# Patient Record
Sex: Female | Born: 1975 | Race: White | Hispanic: No | Smoking: Never smoker
Health system: Southern US, Community
[De-identification: ages and names within clinical notes are randomized; demographics above are authoritative.]

## PROBLEM LIST (undated history)

## (undated) DIAGNOSIS — R87629 Unspecified abnormal cytological findings in specimens from vagina: Secondary | ICD-10-CM

## (undated) DIAGNOSIS — D509 Iron deficiency anemia, unspecified: Secondary | ICD-10-CM

## (undated) HISTORY — PX: OTHER SURGICAL HISTORY: SHX169

## (undated) HISTORY — DX: Unspecified abnormal cytological findings in specimens from vagina: R87.629

## (undated) HISTORY — DX: Iron deficiency anemia, unspecified: D50.9

## (undated) HISTORY — PX: LEEP: SHX91

## (undated) HISTORY — PX: APPENDECTOMY: SHX54

---

## 2015-10-25 LAB — HM PAP SMEAR: HM Pap smear: NORMAL

## 2015-10-26 LAB — HM MAMMOGRAPHY

## 2015-10-30 LAB — HM MAMMOGRAPHY

## 2016-09-30 ENCOUNTER — Ambulatory Visit: Payer: Self-pay | Admitting: Osteopathic Medicine

## 2016-10-08 ENCOUNTER — Ambulatory Visit (INDEPENDENT_AMBULATORY_CARE_PROVIDER_SITE_OTHER): Payer: BLUE CROSS/BLUE SHIELD | Admitting: Osteopathic Medicine

## 2016-10-08 ENCOUNTER — Encounter: Payer: Self-pay | Admitting: Osteopathic Medicine

## 2016-10-08 VITALS — BP 135/82 | HR 99 | Ht 64.0 in | Wt 136.0 lb

## 2016-10-08 DIAGNOSIS — G8929 Other chronic pain: Secondary | ICD-10-CM | POA: Diagnosis not present

## 2016-10-08 DIAGNOSIS — M545 Low back pain, unspecified: Secondary | ICD-10-CM

## 2016-10-08 DIAGNOSIS — Z0000 Encounter for general adult medical examination without abnormal findings: Secondary | ICD-10-CM

## 2016-10-08 NOTE — Patient Instructions (Addendum)
To get old vaccine records: call old [place] or [place] where your husband worked to obtain vaccination records.   If you haven't heard back about chiropractic referral, please call us or call your insurance company to see if this service is covered

## 2016-10-08 NOTE — Progress Notes (Signed)
HPI: Angelica Kelly is a 40 y.o. female  who presents to [place] today, 10/08/16,  for chief complaint of:  Chief Complaint  Patient presents with  . Establish Care    New patient here to establish care, preventive care reviewed as below, no major concerns at this time.   Green card, immigrated with family from [place], 54 or 11/2013. Does not have immunizations on file, she says these are probably on file with her husband's previous employer sponsored their green cards but she is not sure how to access these records.  Request referral to chiropractic for low back pain   Past medical, surgical, social and family history reviewed: History reviewed. No pertinent past medical history. History reviewed. No pertinent surgical history. Social History  Substance Use Topics  . Smoking status: Never Smoker  . Smokeless tobacco: Never Used  . Alcohol use Not on file   History reviewed. No pertinent family history.   Current medication list and allergy/intolerance information reviewed:   No current outpatient prescriptions on file.   No current facility-administered medications for this visit.    No Known Allergies    Review of Systems:  Constitutional:  No  fever, no chills, No recent illness, No unintentional weight changes. No significant fatigue.   HEENT: No  headache, no vision change, no hearing change, No sore throat, No  sinus pressure  Cardiac: No  chest pain, No  pressure, No palpitations, No  Orthopnea  Respiratory:  No  shortness of breath. No  Cough  Gastrointestinal: No  abdominal pain, No  nausea, No  vomiting,  No  blood in stool, No  diarrhea, No  constipation   Musculoskeletal: No new myalgia/arthralgia  Genitourinary: No  incontinence, No  abnormal genital bleeding, No abnormal genital discharge  Skin: No  Rash, No other wounds/concerning lesions  Hem/Onc: No  easy bruising/bleeding, No  abnormal lymph node  Endocrine:  No cold intolerance,  No heat intolerance. No polyuria/polydipsia/polyphagia   Neurologic: No  weakness, No  dizziness, No  slurred speech/focal weakness/facial droop  Psychiatric: No  concerns with depression, No  concerns with anxiety, No sleep problems, No mood problems  Exam:  BP 135/82   Pulse 99   Ht 5\' 4"  (1.626 m)   Wt 136 lb (61.7 kg)   BMI 23.34 kg/m   Constitutional: VS see above. General Appearance: alert, well-developed, well-nourished, NAD  Eyes: Normal lids and conjunctive, non-icteric sclera  Ears, Nose, Mouth, Throat: MMM, Normal external inspection ears/nares/mouth/lips/gums. TM normal bilaterally. Pharynx/tonsils no erythema, no exudate. Nasal mucosa normal.   Neck: No masses, trachea midline. No thyroid enlargement. No tenderness/mass appreciated. No lymphadenopathy  Respiratory: Normal respiratory effort. no wheeze, no rhonchi, no rales  Cardiovascular: S1/S2 normal, no murmur, no rub/gallop auscultated. RRR. No lower extremity edema.  Gastrointestinal: Nontender, no masses. No hepatomegaly, no splenomegaly. No hernia appreciated. Bowel sounds normal. Rectal exam deferred.   Musculoskeletal: Gait normal. No clubbing/cyanosis of digits.   Neurological: Normal balance/coordination. No tremor.    Skin: warm, dry, intact. No rash/ulcer.   Psychiatric: Normal judgment/insight. Normal mood and affect. Oriented x3.      ASSESSMENT/PLAN:   Annual physical exam - Plan: CBC with Differential/Platelet, COMPLETE METABOLIC PANEL WITH GFR, Lipid panel, TSH, Ambulatory referral to Obstetrics / Gynecology  Chronic low back pain without sciatica, unspecified back pain laterality - Plan: Ambulatory referral to [place]  DECREASE CV RISK  Tobacco - noNever   Alcohol - social drinker  Depression - PQH2 Negative  Domestic violence concerns - no  HTN  SCREENING - SEE VITALS  Vaccination status - SEE BELOW  SEXUAL HEALTH  Sexually active in the past year - Yes with female.  STI testing today? - no  Concerns about libido or pain with sex? - no  Plans for pregnancy? - GYN previously had her on OCP but last year stopped taking them, using condoms  INFECTIOUS DISEASE SCREENING  HIV - all 15-65 - does not need  GC/CT - does not need  HepC - DOB [birth year] - does not need  TB - does not need  DISEASE SCREENING  Lipid - needs  DM2 - needs  Osteoporosis - does not need  CANCER SCREENING  Cervical - does not need - letter dated 10/25/2015 reported negative PAP and HOV co-test [facility], [place] [phone])  Breast - does not need - letter from [facility] center in [place], [place], [phone] normal mammogram 10/26/15   Lung - does not need  Colon - does not need  ADULT VACCINATION  Influenza - was offered and declined by the patient  Td - already has  HPV - was not indicated  Zoster - was not indicated  PCV13 - was not indicated  PPSV23 - was not indicated  OTHER  Fall - exercise and Vit D age 15+ - does not need  Consider ASA - age 54-59 - does not need    Patient Instructions  To get old vaccine records: call old [place] or [place] Dept of the company where your husband worked to obtain vaccination records.   If you haven't heard back about chiropractic referral, please call us or call your insurance company to see if this service is covered    Visit summary with medication list and pertinent instructions was printed for patient to review. All questions at time of visit were answered - patient instructed to contact office with any additional concerns. ER/RTC precautions were reviewed with the patient. Follow-up plan: Return in about 1 year (around 10/08/2017) for [place] .

## 2016-10-10 ENCOUNTER — Encounter: Payer: Self-pay | Admitting: Osteopathic Medicine

## 2016-10-22 ENCOUNTER — Encounter: Payer: Self-pay | Admitting: Obstetrics & Gynecology

## 2016-10-22 ENCOUNTER — Ambulatory Visit (INDEPENDENT_AMBULATORY_CARE_PROVIDER_SITE_OTHER): Payer: BLUE CROSS/BLUE SHIELD | Admitting: Obstetrics & Gynecology

## 2016-10-22 VITALS — BP 134/94 | HR 104 | Ht 64.0 in | Wt 137.0 lb

## 2016-10-22 DIAGNOSIS — Z124 Encounter for screening for malignant neoplasm of cervix: Secondary | ICD-10-CM

## 2016-10-22 DIAGNOSIS — Z01419 Encounter for gynecological examination (general) (routine) without abnormal findings: Secondary | ICD-10-CM

## 2016-10-22 DIAGNOSIS — Z1151 Encounter for screening for human papillomavirus (HPV): Secondary | ICD-10-CM | POA: Diagnosis not present

## 2016-10-22 NOTE — Progress Notes (Signed)
Subjective:    Angelica Kelly is a 40 y.o. MW P1 (12 daughter) female who presents for an annual exam. The patient has no complaints today. The patient is sexually active. GYN screening history: last pap: was normal. The patient wears seatbelts: yes. The patient participates in regular exercise: yes. Has the patient ever been transfused or tattooed?: no. The patient reports that there is not domestic violence in her life.   Menstrual History: OB History    Gravida Para Term Preterm AB Living   1 1 1     1    SAB TAB Ectopic Multiple Live Births                  Menarche age: 22 Patient's last menstrual period was 10/04/2016.    The following portions of the patient's history were reviewed and updated as appropriate: allergies, current medications, past family history, past medical history, past social history, past surgical history and problem list.  Review of Systems Pertinent items are noted in HPI.   Married for 13 years Using condoms  Declines flu vaccine Mammogram due FH- No breast/gyn/colon cancer FP has scheduled her for fasting labs Periods monthly   Objective:    BP (!) 134/94   Pulse (!) 104   Ht 5\' 4"  (1.626 m)   Wt 137 lb (62.1 kg)   LMP 10/04/2016   BMI 23.52 kg/m   General Appearance:    Alert, cooperative, no distress, appears stated age  Head:    Normocephalic, without obvious abnormality, atraumatic  Eyes:    PERRL, conjunctiva/corneas clear, EOM's intact, fundi    benign, both eyes  Ears:    Normal TM's and external ear canals, both ears  Nose:   Nares normal, septum midline, mucosa normal, no drainage    or sinus tenderness  Throat:   Lips, mucosa, and tongue normal; teeth and gums normal  Neck:   Supple, symmetrical, trachea midline, no adenopathy;    thyroid:  no enlargement/tenderness/nodules; no carotid   bruit or JVD  Back:     Symmetric, no curvature, ROM normal, no CVA tenderness  Lungs:     Clear to auscultation bilaterally, respirations  unlabored  Chest Wall:    No tenderness or deformity   Heart:    Regular rate and rhythm, S1 and S2 normal, no murmur, rub   or gallop  Breast Exam:    No tenderness, masses, or nipple abnormality  Abdomen:     Soft, non-tender, bowel sounds active all four quadrants,    no masses, no organomegaly  Genitalia:    Normal female without lesion, discharge or tenderness, NSSA, NT, mobile, normal adnexal masses     Extremities:   Extremities normal, atraumatic, no cyanosis or edema  Pulses:   2+ and symmetric all extremities  Skin:   Skin color, texture, turgor normal, no rashes or lesions  Lymph nodes:   Cervical, supraclavicular, and axillary nodes normal  Neurologic:   CNII-XII intact, normal strength, sensation and reflexes    throughout   .    Assessment:    Healthy female exam.    Plan:     Breast self exam technique reviewed and patient encouraged to perform self-exam monthly. Thin prep Pap smear. with cotesting Mammogram

## 2016-10-24 LAB — CYTOLOGY - PAP
DIAGNOSIS: NEGATIVE
HPV: NOT DETECTED

## 2016-11-06 ENCOUNTER — Encounter: Payer: Self-pay | Admitting: Osteopathic Medicine

## 2016-11-06 ENCOUNTER — Ambulatory Visit (INDEPENDENT_AMBULATORY_CARE_PROVIDER_SITE_OTHER): Payer: BLUE CROSS/BLUE SHIELD | Admitting: Osteopathic Medicine

## 2016-11-06 VITALS — BP 141/81 | HR 92 | Wt 137.0 lb

## 2016-11-06 DIAGNOSIS — K219 Gastro-esophageal reflux disease without esophagitis: Secondary | ICD-10-CM | POA: Diagnosis not present

## 2016-11-06 NOTE — Progress Notes (Signed)
HPI: Angelica Kelly is a 40 y.o. female  who presents to [facility] today, 11/06/16,  for chief complaint of:  Chief Complaint  Patient presents with  . Gastroesophageal Reflux    2 days ago burning in chest and upper back after eating spicy food; occured after laying down.     . Context: Heartburn type symptoms/chest pain 2 days ago, has altered diet in the meantime and this seems to have helped. Patient likes but seafood, drink a lot of coffee and wine. Has been more sedentary since not working . Location: Chest/epigastrium . Quality: Burning type pain, stomach feels hollow . Modifying factors: Pepto-Bismol seemed to help some, bland diet has helped . Assoc signs/symptoms: Chronic thoracic back pain seems to get worse with this    Past medical, surgical, social and family history reviewed: Patient Active Problem List   Diagnosis Date Noted  . Chronic low back pain without sciatica 10/08/2016  . Annual physical exam 10/08/2016   Past Surgical History:  Procedure Laterality Date  . APPENDECTOMY    . cervical cryo    . LEEP     Social History  Substance Use Topics  . Smoking status: Never Smoker  . Smokeless tobacco: Never Used  . Alcohol use Yes     Comment: occ   No family history on file.   Current medication list and allergy/intolerance information reviewed:   No current outpatient prescriptions on file prior to visit.   No current facility-administered medications on file prior to visit.    No Known Allergies    Review of Systems:  Constitutional: No recent illness  HEENT: No  headache, no vision change  Cardiac: No  chest pain, No  pressure, No palpitations  Respiratory:  No  shortness of breath. No  Cough  Gastrointestinal: No  abdominal pain, no change on bowel habits  Musculoskeletal: No new myalgia/arthralgia  Skin: No  Rash  Hem/Onc: No  easy bruising/bleeding, No  abnormal lumps/bumps  Neurologic: No   weakness, No  Dizziness  Psychiatric: No  concerns with depression, No  concerns with anxiety  Exam:  BP (!) 141/81   Pulse 92   Wt 137 lb (62.1 kg)   LMP 10/04/2016   BMI 23.52 kg/m   Constitutional: VS see above. General Appearance: alert, well-developed, well-nourished, NAD  Eyes: Normal lids and conjunctive, non-icteric sclera  Ears, Nose, Mouth, Throat: MMM, Normal external inspection ears/nares/mouth/lips/gums.  Neck: No masses, trachea midline.   Respiratory: Normal respiratory effort. no wheeze, no rhonchi, no rales  Cardiovascular: S1/S2 normal, no murmur, no rub/gallop auscultated. RRR.   Musculoskeletal: Gait normal. Symmetric and independent movement of all extremities. Mild paraspinal tenderness mid thoracic, patient states chronic, normal thoracic range of motion,  GI: No hepatosplenomegaly, no guarding or rebound, bowel sounds normal 4  Neurological: Normal balance/coordination. No tremor.  Skin: warm, dry, intact.   Psychiatric: Normal judgment/insight. Normal mood and affect. Oriented x3.     ASSESSMENT/PLAN: OTC medications advised, list out modifications have already resulted in improvement in symptoms. Additional information printed regarding further lifestyle changes, foods to avoid, ER/RTC precautions  Gastroesophageal reflux disease, esophagitis presence not specified    Patient Instructions  OTC treatments for Acid Reflux: Pepto-Bismol Tums tablets  Long-Term treatments with a prescription are an option if this is happening a lot of if not getting better: Omeprazole (Prilosec) or Ranitidine (Zantac)     Visit summary with medication list and pertinent instructions was printed for patient to  review. All questions at time of visit were answered - patient instructed to contact office with any additional concerns. ER/RTC precautions were reviewed with the patient. Follow-up plan: No Follow-up on file.

## 2016-11-06 NOTE — Patient Instructions (Signed)
OTC treatments for Acid Reflux: Pepto-Bismol Tums tablets  Long-Term treatments with a prescription are an option if this is happening a lot of if not getting better: Omeprazole (Prilosec) or Ranitidine (Zantac)    Food Choices for Gastroesophageal Reflux Disease, Adult When you have gastroesophageal reflux disease (GERD), the foods you eat and your eating habits are very important. Choosing the right foods can help ease the discomfort of GERD. What general guidelines do I need to follow?  Choose fruits, vegetables, whole grains, low-fat dairy products, and low-fat meat, fish, and poultry.  Limit fats such as oils, salad dressings, butter, nuts, and avocado.  Keep a food diary to identify foods that cause symptoms.  Avoid foods that cause reflux. These may be different for different people.  Eat frequent small meals instead of three large meals each day.  Eat your meals slowly, in a relaxed setting.  Limit fried foods.  Cook foods using methods other than frying.  Avoid drinking alcohol.  Avoid drinking large amounts of liquids with your meals.  Avoid bending over or lying down until 2-3 hours after eating. What foods are not recommended? The following are some foods and drinks that may worsen your symptoms: Vegetables  Tomatoes. Tomato juice. Tomato and spaghetti sauce. Chili peppers. Onion and garlic. Horseradish. Fruits  Oranges, grapefruit, and lemon (fruit and juice). Meats  High-fat meats, fish, and poultry. This includes hot dogs, ribs, ham, sausage, salami, and bacon. Dairy  Whole milk and chocolate milk. Sour cream. Cream. Butter. Ice cream. Cream cheese. Beverages  Coffee and tea, with or without caffeine. Carbonated beverages or energy drinks. Condiments  Hot sauce. Barbecue sauce. Sweets/Desserts  Chocolate and cocoa. Donuts. Peppermint and spearmint. Fats and Oils  High-fat foods, including [place] fries and potato chips. Other  Vinegar. Strong spices,  such as black pepper, white pepper, red pepper, cayenne, curry powder, cloves, ginger, and chili powder. The items listed above may not be a complete list of foods and beverages to avoid. Contact your dietitian for more information.  This information is not intended to replace advice given to you by your health care provider. Make sure you discuss any questions you have with your health care provider. Document Released: 12/02/2005 Document Revised: 05/09/2016 Document Reviewed: 10/06/2013 Elsevier Interactive Patient Education  2017 Elsevier Inc.   Heartburn Heartburn is a type of pain or discomfort that can happen in the throat or chest. It is often described as a burning pain. It may also cause a bad taste in the mouth. Heartburn may feel worse when you lie down or bend over, and it is often worse at night. Heartburn may be caused by stomach contents that move back up into the esophagus (reflux). Follow these instructions at home: Take these actions to decrease your discomfort and to help avoid complications. Diet  Follow a diet as recommended by your health care provider. This may involve avoiding foods and drinks such as:  Coffee and tea (with or without caffeine).  Drinks that contain alcohol.  Energy drinks and sports drinks.  Carbonated drinks or sodas.  Chocolate and cocoa.  Peppermint and mint flavorings.  Garlic and onions.  Horseradish.  Spicy and acidic foods, including peppers, chili powder, curry powder, vinegar, hot sauces, and barbecue sauce.  Citrus fruit juices and citrus fruits, such as oranges, lemons, and limes.  Tomato-based foods, such as red sauce, chili, salsa, and pizza with red sauce.  Fried and fatty foods, such as donuts, french fries, potato chips, and high-fat  dressings.  High-fat meats, such as hot dogs and fatty cuts of red and white meats, such as rib eye steak, sausage, ham, and bacon.  High-fat dairy items, such as whole milk, butter, and  cream cheese.  Eat small, frequent meals instead of large meals.  Avoid drinking large amounts of liquid with your meals.  Avoid eating meals during the 2-3 hours before bedtime.  Avoid lying down right after you eat.  Do not exercise right after you eat. General instructions  Pay attention to any changes in your symptoms.  Take over-the-counter and prescription medicines only as told by your health care provider. Do not take aspirin, ibuprofen, or other NSAIDs unless your health care provider told you to do so.  Do not use any tobacco products, including cigarettes, chewing tobacco, and e-cigarettes. If you need help quitting, ask your health care provider.  Wear loose-fitting clothing. Do not wear anything tight around your waist that causes pressure on your abdomen.  Raise (elevate) the head of your bed about 6 inches (15 cm).  Try to reduce your stress, such as with yoga or meditation. If you need help reducing stress, ask your health care provider.  If you are overweight, reduce your weight to an amount that is healthy for you. Ask your health care provider for guidance about a safe weight loss goal.  Keep all follow-up visits as told by your health care provider. This is important. Contact a health care provider if:  You have new symptoms.  You have unexplained weight loss.  You have difficulty swallowing, or it hurts to swallow.  You have wheezing or a persistent cough.  Your symptoms do not improve with treatment.  You have frequent heartburn for more than two weeks. Get help right away if:  You have pain in your arms, neck, jaw, teeth, or back.  You feel sweaty, dizzy, or light-headed.  You have chest pain or shortness of breath.  You vomit and your vomit looks like blood or coffee grounds.  Your stool is bloody or black. This information is not intended to replace advice given to you by your health care provider. Make sure you discuss any questions you  have with your health care provider. Document Released: 04/20/2009 Document Revised: 05/09/2016 Document Reviewed: 03/29/2015 Elsevier Interactive Patient Education  2017 [employer].

## 2016-11-19 ENCOUNTER — Ambulatory Visit (INDEPENDENT_AMBULATORY_CARE_PROVIDER_SITE_OTHER): Payer: BLUE CROSS/BLUE SHIELD

## 2016-11-19 DIAGNOSIS — Z1239 Encounter for other screening for malignant neoplasm of breast: Secondary | ICD-10-CM

## 2016-11-19 DIAGNOSIS — Z1231 Encounter for screening mammogram for malignant neoplasm of breast: Secondary | ICD-10-CM | POA: Diagnosis not present

## 2016-11-19 DIAGNOSIS — Z01419 Encounter for gynecological examination (general) (routine) without abnormal findings: Secondary | ICD-10-CM

## 2017-01-30 ENCOUNTER — Telehealth: Payer: Self-pay | Admitting: Osteopathic Medicine

## 2017-01-30 NOTE — Telephone Encounter (Signed)
Pt declined flu shot °

## 2017-02-19 ENCOUNTER — Ambulatory Visit (INDEPENDENT_AMBULATORY_CARE_PROVIDER_SITE_OTHER): Payer: BLUE CROSS/BLUE SHIELD | Admitting: Osteopathic Medicine

## 2017-02-19 ENCOUNTER — Encounter: Payer: Self-pay | Admitting: Osteopathic Medicine

## 2017-02-19 VITALS — BP 122/79 | HR 89 | Ht 64.0 in | Wt 133.0 lb

## 2017-02-19 DIAGNOSIS — R0789 Other chest pain: Secondary | ICD-10-CM | POA: Diagnosis not present

## 2017-02-19 DIAGNOSIS — G8929 Other chronic pain: Secondary | ICD-10-CM | POA: Diagnosis not present

## 2017-02-19 DIAGNOSIS — R1013 Epigastric pain: Secondary | ICD-10-CM | POA: Diagnosis not present

## 2017-02-19 DIAGNOSIS — M546 Pain in thoracic spine: Secondary | ICD-10-CM

## 2017-02-19 LAB — COMPLETE METABOLIC PANEL WITH GFR
ALBUMIN: 4.5 g/dL (ref 3.6–5.1)
ALK PHOS: 59 U/L (ref 33–115)
ALT: 8 U/L (ref 6–29)
AST: 15 U/L (ref 10–30)
BUN: 7 mg/dL (ref 7–25)
CALCIUM: 9.3 mg/dL (ref 8.6–10.2)
CHLORIDE: 104 mmol/L (ref 98–110)
CO2: 27 mmol/L (ref 20–31)
Creat: 0.67 mg/dL (ref 0.50–1.10)
GFR, Est African American: >89 mL/min (ref >=60)
Glucose, Bld: 91 mg/dL (ref 65–99)
POTASSIUM: 3.7 mmol/L (ref 3.5–5.3)
Sodium: 139 mmol/L (ref 135–146)
Total Bilirubin: 1.3 mg/dL — ABNORMAL HIGH (ref 0.2–1.2)
Total Protein: 7.9 g/dL (ref 6.1–8.1)

## 2017-02-19 LAB — LIPID PANEL
CHOL/HDL RATIO: 3 ratio (ref <5.0)
CHOLESTEROL: 200 mg/dL — AB (ref <200)
HDL: 67 mg/dL (ref >50)
LDL Cholesterol: 117 mg/dL — ABNORMAL HIGH (ref <100)
TRIGLYCERIDES: 81 mg/dL (ref <150)
VLDL: 16 mg/dL (ref <30)

## 2017-02-19 LAB — CBC WITH DIFFERENTIAL/PLATELET
BASOS ABS: 93 {cells}/uL (ref 0–200)
Basophils Relative: 3 %
EOS PCT: 2 %
Eosinophils Absolute: 62 cells/uL (ref 15–500)
HCT: 35.2 % (ref 35.0–45.0)
Hemoglobin: 11.4 g/dL — ABNORMAL LOW (ref 11.7–15.5)
LYMPHS PCT: 31 %
Lymphs Abs: 961 cells/uL (ref 850–3900)
MCH: 24.9 pg — AB (ref 27.0–33.0)
MCHC: 32.4 g/dL (ref 32.0–36.0)
MCV: 77 fL — ABNORMAL LOW (ref 80.0–100.0)
MPV: 8.8 fL (ref 7.5–12.5)
Monocytes Absolute: 496 cells/uL (ref 200–950)
Monocytes Relative: 16 %
NEUTROS PCT: 48 %
Neutro Abs: 1488 cells/uL — ABNORMAL LOW (ref 1500–7800)
Platelets: 325 10*3/uL (ref 140–400)
RBC: 4.57 MIL/uL (ref 3.80–5.10)
RDW: 16 % — AB (ref 11.0–15.0)
WBC: 3.1 10*3/uL — AB (ref 3.8–10.8)

## 2017-02-19 LAB — TSH: TSH: 2.77 mIU/L

## 2017-02-19 NOTE — Patient Instructions (Signed)
We are arranging a referral to a GI specialist who can further evaluate this abdominal discomfort. They may decide to do a scope down the throat to take a look at the stomach internally with a camera, or they may recommend other testing. If you develop serious abdominal pain along with other symptoms such as fever, nausea, throwing up, blood in the stool, or other concerning symptoms please seek medical care.

## 2017-02-19 NOTE — Progress Notes (Signed)
HPI: Angelica Kelly is a 41 y.o. female  who presents to [facility] today, 02/19/17,  for chief complaint of:  Chief Complaint  Patient presents with  . Follow-up    Acid reflux     Last seen 11/06/2016 for GERD. Seen for single episode of burning in chest and upper back after eating spicy food, worse after lying down. Described stomach as having a hollow feeling. Chronic thoracic back pain seems to get a little bit worse with this episode. Pepto-Bismol helps at that point. Patient is having some persistent feelings of something sliding in the stomach area, muscle pain in the thoracic region between the shoulder blades, bilaterally. No difficulty swallowing. No nausea/vomiting. Associated with chest discomfort and feeling like heart skips a beat sometimes but doesn't always corresponds to abdominal pain. Patient's father has a history of cancer, she is concerned that it may be something serious. Associated with fatigue, patient has not gotten lab work done.    Past medical history, surgical history, social history and family history reviewed.  Patient Active Problem List   Diagnosis Date Noted  . Chronic low back pain without sciatica 10/08/2016  . Annual physical exam 10/08/2016    Current medication list and allergy/intolerance information reviewed.   No current outpatient prescriptions on file prior to visit.   No current facility-administered medications on file prior to visit.    No Known Allergies    Review of Systems:  Constitutional: No recent illness  HEENT: No  headache, no vision change  Cardiac: +chest pain as per HPI, No  pressure, No palpitations  Respiratory:  No  shortness of breath. No  Cough  Gastrointestinal: +abdominal pain asper HPI, no change on bowel habits  Musculoskeletal: +myalgia/arthralgia in back as per HPI  Neurologic: No  weakness, No  Dizziness   Exam:  BP 122/79   Pulse 89   Ht 5\' 4"  (1.626 m)   Wt  133 lb (60.3 kg)   BMI 22.83 kg/m   Constitutional: VS see above. General Appearance: alert, well-developed, well-nourished, NAD  Eyes: Normal lids and conjunctive, non-icteric sclera  Ears, Nose, Mouth, Throat: MMM, Normal external inspection ears/nares/mouth/lips/gums.  Neck: No masses, trachea midline.   Respiratory: Normal respiratory effort. no wheeze, no rhonchi, no rales  Cardiovascular: S1/S2 normal, no murmur, no rub/gallop auscultated. RRR.   Musculoskeletal: Gait normal. Symmetric and independent movement of all extremities  Abdominal: Nontender, nondistended. Bowel sounds normal 4. Rectal exam deferred.  Neurological: Normal balance/coordination. No tremor.  Skin: warm, dry, intact.   Psychiatric: Normal judgment/insight. Anxious mood and affect. Oriented x3.    EKG interpretation: Rate: 80 Rhythm: sinus No ST/T changes concerning for acute ischemia/infarct    ASSESSMENT/PLAN:   Epigastric pain - No alarm signs for serious pathology but patient is quite concerned. GI referral, consider scope versus other workup, ? Hiatal hernia, GERD, anxiety - Plan: Ambulatory referral to Gastroenterology  Chest discomfort - No angina or claudication, sounds like PVC, EKG today NSR, no ST/T concerns   Chronic bilateral thoracic back pain - Possible viscerosomatic reflex from epigastric/stomach discomfort    Patient Instructions  We are arranging a referral to a GI specialist who can further evaluate this abdominal discomfort. They may decide to do a scope down the throat to take a look at the stomach internally with a camera, or they may recommend other testing. If you develop serious abdominal pain along with other symptoms such as fever, nausea, throwing up, blood in the  stool, or other concerning symptoms please seek medical care.     Follow-up plan: Return if symptoms worsen or fail to improve.  Visit summary with medication list and pertinent instructions was  printed for patient to review, alert [place] if any changes needed. All questions at time of visit were answered - patient instructed to contact office with any additional concerns. ER/RTC precautions were reviewed with the patient and understanding verbalized.

## 2017-02-19 NOTE — Addendum Note (Signed)
Addended by: Doree Albee on: 02/19/2017 09:52 AM   Modules accepted: Orders

## 2017-02-21 ENCOUNTER — Other Ambulatory Visit: Payer: Self-pay | Admitting: Osteopathic Medicine

## 2017-02-21 ENCOUNTER — Other Ambulatory Visit: Payer: Self-pay

## 2017-02-21 DIAGNOSIS — D508 Other iron deficiency anemias: Secondary | ICD-10-CM

## 2017-02-21 DIAGNOSIS — D619 Aplastic anemia, unspecified: Secondary | ICD-10-CM

## 2017-02-21 DIAGNOSIS — D649 Anemia, unspecified: Secondary | ICD-10-CM

## 2017-02-25 DIAGNOSIS — M9901 Segmental and somatic dysfunction of cervical region: Secondary | ICD-10-CM | POA: Diagnosis not present

## 2017-02-25 DIAGNOSIS — Q72812 Congenital shortening of left lower limb: Secondary | ICD-10-CM | POA: Diagnosis not present

## 2017-02-25 DIAGNOSIS — M5383 Other specified dorsopathies, cervicothoracic region: Secondary | ICD-10-CM | POA: Diagnosis not present

## 2017-02-25 DIAGNOSIS — M9905 Segmental and somatic dysfunction of pelvic region: Secondary | ICD-10-CM | POA: Diagnosis not present

## 2017-02-25 LAB — CBC WITH DIFFERENTIAL/PLATELET
BASOS ABS: 78 {cells}/uL (ref 0–200)
BASOS PCT: 2 %
EOS ABS: 39 {cells}/uL (ref 15–500)
Eosinophils Relative: 1 %
HEMATOCRIT: 35 % (ref 35.0–45.0)
Hemoglobin: 11.3 g/dL — ABNORMAL LOW (ref 11.7–15.5)
Lymphocytes Relative: 32 %
Lymphs Abs: 1248 cells/uL (ref 850–3900)
MCH: 25 pg — ABNORMAL LOW (ref 27.0–33.0)
MCHC: 32.3 g/dL (ref 32.0–36.0)
MCV: 77.4 fL — AB (ref 80.0–100.0)
MONO ABS: 507 {cells}/uL (ref 200–950)
MPV: 9.1 fL (ref 7.5–12.5)
Monocytes Relative: 13 %
Neutro Abs: 2028 cells/uL (ref 1500–7800)
Neutrophils Relative %: 52 %
Platelets: 300 10*3/uL (ref 140–400)
RBC: 4.52 MIL/uL (ref 3.80–5.10)
RDW: 16.3 % — AB (ref 11.0–15.0)
WBC: 3.9 10*3/uL (ref 3.8–10.8)

## 2017-02-26 ENCOUNTER — Encounter: Payer: Self-pay | Admitting: Physician Assistant

## 2017-02-26 DIAGNOSIS — D509 Iron deficiency anemia, unspecified: Secondary | ICD-10-CM | POA: Insufficient documentation

## 2017-02-26 LAB — IRON,TIBC AND FERRITIN PANEL
%SAT: 5 % — AB (ref 11–50)
Ferritin: 6 ng/mL — ABNORMAL LOW (ref 10–232)
Iron: 21 ug/dL — ABNORMAL LOW (ref 40–190)
TIBC: 382 ug/dL (ref 250–450)

## 2017-02-26 MED ORDER — FERROUS SULFATE 325 (65 FE) MG PO TBEC: 325.0000 mg | DELAYED_RELEASE_TABLET | Freq: Two times a day (BID) | ORAL | 1 refills | Status: DC

## 2017-02-27 ENCOUNTER — Ambulatory Visit (INDEPENDENT_AMBULATORY_CARE_PROVIDER_SITE_OTHER): Payer: BLUE CROSS/BLUE SHIELD | Admitting: Osteopathic Medicine

## 2017-02-27 ENCOUNTER — Encounter: Payer: Self-pay | Admitting: Osteopathic Medicine

## 2017-02-27 VITALS — BP 120/62 | HR 72 | Ht 64.0 in | Wt 135.0 lb

## 2017-02-27 DIAGNOSIS — M546 Pain in thoracic spine: Secondary | ICD-10-CM | POA: Diagnosis not present

## 2017-02-27 DIAGNOSIS — R1013 Epigastric pain: Secondary | ICD-10-CM | POA: Diagnosis not present

## 2017-02-27 DIAGNOSIS — Z7189 Other specified counseling: Secondary | ICD-10-CM | POA: Diagnosis not present

## 2017-02-27 DIAGNOSIS — G8929 Other chronic pain: Secondary | ICD-10-CM

## 2017-02-27 DIAGNOSIS — D508 Other iron deficiency anemias: Secondary | ICD-10-CM | POA: Diagnosis not present

## 2017-02-27 NOTE — Progress Notes (Signed)
HPI: Angelica Kelly is a 41 y.o. female  who presents to [facility] today, 02/27/17,  for chief complaint of:  Chief Complaint  Patient presents with  . Follow-up    LAB RESULTS    Iron deficiency anemia: New diagnosis based on lab results. I sent an iron supplement but the patient has not started this yet. Reviewed lab work in detail, see below. All questions were answered.  GI: Patient has appointment set up with GI doctor but epigastric pain seems to have gotten better. She has been seen here twice for GERD-type symptoms, possible esophageal spasm. Is reluctant to trial medications for these symptoms. Is having some associated back pain that she has some questions about whether it is related.   Past medical history, surgical history, social history and family history reviewed.  Patient Active Problem List   Diagnosis Date Noted  . Iron deficiency anemia 02/26/2017  . Epigastric pain 02/19/2017  . Chronic bilateral thoracic back pain 02/19/2017  . Chest discomfort 02/19/2017  . Chronic low back pain without sciatica 10/08/2016  . Annual physical exam 10/08/2016    Current medication list and allergy/intolerance information reviewed.   Current Outpatient Prescriptions on File Prior to Visit  Medication Sig Dispense Refill  . ferrous sulfate 325 (65 FE) MG EC tablet Take 1 tablet (325 mg total) by mouth 2 (two) times daily with a meal. (Patient not taking: Reported on 02/27/2017) 180 tablet 1   No current facility-administered medications on file prior to visit.    No Known Allergies    Review of Systems:  Cardiac: No  chest painCough  Gastrointestinal: No  abdominal pain at this time  Neurologic: No  weakness, No  Dizziness   Exam:  BP 120/62   Pulse 72   Ht 5' 4"  (1.626 m)   Wt 135 lb (61.2 kg)   BMI 23.17 kg/m   Constitutional: VS see above. General Appearance: alert, well-developed, well-nourished, NAD  Psychiatric: Normal  judgment/insight. Normal mood and affect. Oriented x3.    Recent Results (from the past 2160 hour(s))  CBC with Differential/Platelet     Status: Abnormal   Collection Time: 02/19/17  9:56 AM  Result Value Ref Range   WBC 3.1 (L) 3.8 - 10.8 K/uL   RBC 4.57 3.80 - 5.10 MIL/uL   Hemoglobin 11.4 (L) 11.7 - 15.5 g/dL   HCT 35.2 35.0 - 45.0 %   MCV 77.0 (L) 80.0 - 100.0 fL   MCH 24.9 (L) 27.0 - 33.0 pg   MCHC 32.4 32.0 - 36.0 g/dL   RDW 16.0 (H) 11.0 - 15.0 %   Platelets 325 140 - 400 K/uL   MPV 8.8 7.5 - 12.5 fL   Neutro Abs 1,488 (L) 1,500 - 7,800 cells/uL   Lymphs Abs 961 850 - 3,900 cells/uL   Monocytes Absolute 496 200 - 950 cells/uL   Eosinophils Absolute 62 15 - 500 cells/uL   Basophils Absolute 93 0 - 200 cells/uL   Neutrophils Relative % 48 %   Lymphocytes Relative 31 %   Monocytes Relative 16 %   Eosinophils Relative 2 %   Basophils Relative 3 %   Smear Review Criteria for review not met   COMPLETE METABOLIC PANEL WITH GFR     Status: Abnormal   Collection Time: 02/19/17  9:56 AM  Result Value Ref Range   Sodium 139 135 - 146 mmol/L   Potassium 3.7 3.5 - 5.3 mmol/L   Chloride 104 98 -  110 mmol/L   CO2 27 20 - 31 mmol/L   Glucose, Bld 91 65 - 99 mg/dL   BUN 7 7 - 25 mg/dL   Creat 0.67 0.50 - 1.10 mg/dL   Total Bilirubin 1.3 (H) 0.2 - 1.2 mg/dL   Alkaline Phosphatase 59 33 - 115 U/L   AST 15 10 - 30 U/L   ALT 8 6 - 29 U/L   Total Protein 7.9 6.1 - 8.1 g/dL   Albumin 4.5 3.6 - 5.1 g/dL   Calcium 9.3 8.6 - 10.2 mg/dL   GFR, Est African American >89 >=60 mL/min   GFR, Est Non African American >89 >=60 mL/min  Lipid panel     Status: Abnormal   Collection Time: 02/19/17  9:56 AM  Result Value Ref Range   Cholesterol 200 (H) <200 mg/dL   Triglycerides 81 <150 mg/dL   HDL 67 >50 mg/dL   Total CHOL/HDL Ratio 3.0 <5.0 Ratio   VLDL 16 <30 mg/dL   LDL Cholesterol 117 (H) <100 mg/dL  TSH     Status: None   Collection Time: 02/19/17  9:56 AM  Result Value Ref Range    TSH 2.77 mIU/L    Comment:   Reference Range   > or = 20 Years  0.40-4.50   Pregnancy Range First trimester  0.26-2.66 Second trimester 0.55-2.73 Third trimester  0.43-2.91     CBC with Differential/Platelet     Status: Abnormal   Collection Time: 02/25/17 11:22 AM  Result Value Ref Range   WBC 3.9 3.8 - 10.8 K/uL   RBC 4.52 3.80 - 5.10 MIL/uL   Hemoglobin 11.3 (L) 11.7 - 15.5 g/dL   HCT 35.0 35.0 - 45.0 %   MCV 77.4 (L) 80.0 - 100.0 fL   MCH 25.0 (L) 27.0 - 33.0 pg   MCHC 32.3 32.0 - 36.0 g/dL   RDW 16.3 (H) 11.0 - 15.0 %   Platelets 300 140 - 400 K/uL   MPV 9.1 7.5 - 12.5 fL   Neutro Abs 2,028 1,500 - 7,800 cells/uL   Lymphs Abs 1,248 850 - 3,900 cells/uL   Monocytes Absolute 507 200 - 950 cells/uL   Eosinophils Absolute 39 15 - 500 cells/uL   Basophils Absolute 78 0 - 200 cells/uL   Neutrophils Relative % 52 %   Lymphocytes Relative 32 %   Monocytes Relative 13 %   Eosinophils Relative 1 %   Basophils Relative 2 %   Smear Review Criteria for review not met   Iron, TIBC and Ferritin Panel     Status: Abnormal   Collection Time: 02/25/17 11:22 AM  Result Value Ref Range   Ferritin 6 (L) 10 - 232 ng/mL   Iron 21 (L) 40 - 190 ug/dL   TIBC 382 250 - 450 ug/dL   %SAT 5 (L) 11 - 50 %      ASSESSMENT/PLAN: The primary encounter diagnosis was Other iron deficiency anemia. Diagnoses of Epigastric pain, Chronic bilateral thoracic back pain, and Cardiac risk counseling were also pertinent to this visit.  Encouraged patient to keep follow-up appointment with GI We'll plan to recheck CBC and iron panel in 3 months. Discussed possible side effects of the medication and ways to mitigate these.  Orders Placed This Encounter  Procedures  . CBC  . Iron, TIBC and Ferritin Panel   orders placed today for follow-up blood work in 3 months     Follow-up plan: Return in about 3 months (around 05/30/2017) for LAB  in 3 months to recheck iron levels and anemia, otherwise for annual  physical when due.  Visit summary with medication list and pertinent instructions was printed for patient to review, alert [place] if any changes needed. All questions at time of visit were answered - patient instructed to contact office with any additional concerns. ER/RTC precautions were reviewed with the patient and understanding verbalized.   Note: Total time spent 15 minutes, greater than 50% of the visit was spent face-to-face counseling and coordinating care for the following: The primary encounter diagnosis was Other iron deficiency anemia. Diagnoses of Epigastric pain and Chronic bilateral thoracic back pain were also pertinent to this visit.Marland Kitchen

## 2017-03-07 ENCOUNTER — Encounter: Payer: Self-pay | Admitting: Physician Assistant

## 2017-03-07 ENCOUNTER — Ambulatory Visit (INDEPENDENT_AMBULATORY_CARE_PROVIDER_SITE_OTHER): Payer: BLUE CROSS/BLUE SHIELD | Admitting: Physician Assistant

## 2017-03-07 VITALS — BP 110/70 | HR 80 | Ht 64.0 in | Wt 134.6 lb

## 2017-03-07 DIAGNOSIS — R1013 Epigastric pain: Secondary | ICD-10-CM

## 2017-03-07 DIAGNOSIS — M5383 Other specified dorsopathies, cervicothoracic region: Secondary | ICD-10-CM | POA: Diagnosis not present

## 2017-03-07 DIAGNOSIS — D508 Other iron deficiency anemias: Secondary | ICD-10-CM | POA: Diagnosis not present

## 2017-03-07 DIAGNOSIS — K219 Gastro-esophageal reflux disease without esophagitis: Secondary | ICD-10-CM | POA: Diagnosis not present

## 2017-03-07 DIAGNOSIS — M9901 Segmental and somatic dysfunction of cervical region: Secondary | ICD-10-CM | POA: Diagnosis not present

## 2017-03-07 DIAGNOSIS — Q72812 Congenital shortening of left lower limb: Secondary | ICD-10-CM | POA: Diagnosis not present

## 2017-03-07 DIAGNOSIS — M9905 Segmental and somatic dysfunction of pelvic region: Secondary | ICD-10-CM | POA: Diagnosis not present

## 2017-03-07 MED ORDER — OMEPRAZOLE 20 MG PO CPDR: 20.0000 mg | DELAYED_RELEASE_CAPSULE | Freq: Every day | ORAL | 3 refills | Status: DC

## 2017-03-07 NOTE — Patient Instructions (Signed)
We have sent the following medications to your pharmacy for you to pick up at your convenience:  Omeprazole 20 mg daily 

## 2017-03-07 NOTE — Progress Notes (Signed)
Chief Complaint: GERD, epigastric abdominal pain, IDA  HPI:  Angelica Kelly is a 41 year old female with a past medical history as below, who was referred to me by Emeterio Reeve, DO for a complaint of GERD, epigastric pain and IDA.     Patient had recent CBC 02/25/17 showing a hemoglobin minimally decreased at 11.3 with an MCV low at 77.4. Iron studies show ferritin low at 6, iron low at 21 and percent saturation low at 5.   Today, the patient describes that she developed an epigastric pain a few months ago and had two "elongated" episodes of reflux. After that she changed her diet. Previously she had been eating up until midnight and drinking red wine as well as a lot of coffee. She tells me that altering her diet did help her stop her reflux but she does continue with an epigastric pain that she sometimes feels more when she bends over. Recently  the patient was feeling somewhat fatigued and tells me she was recently told that she was iron deficient. She does admit to me that she does not eat red meat at all and has a few vegetables throughout the day. She believes that most of her symptoms are related to her diet.   Patient denies fever, chills, blood in her stool, melena, weight loss, anorexia, nausea, vomiting or symptoms that awaken her at night.  Past Medical History:  Diagnosis Date  . Iron deficiency anemia   . Vaginal Pap smear, abnormal     Past Surgical History:  Procedure Laterality Date  . APPENDECTOMY    . cervical cryo    . LEEP      Current Outpatient Prescriptions  Medication Sig Dispense Refill  . docusate sodium (STOOL SOFTENER) 100 MG capsule Take 100 mg by mouth daily as needed for mild constipation.    . ferrous sulfate 325 (65 FE) MG EC tablet Take 1 tablet (325 mg total) by mouth 2 (two) times daily with a meal. 180 tablet 1  . omeprazole (PRILOSEC) 20 MG capsule Take 1 capsule (20 mg total) by mouth daily before breakfast. 30 capsule 3   No current  facility-administered medications for this visit.     Allergies as of 03/07/2017  . (No Known Allergies)    Family History  Problem Relation Age of Onset  . Colon cancer Neg Hx   . Esophageal cancer Neg Hx   . AAA (abdominal aortic aneurysm) Neg Hx     Social History   Social History  . Marital status: Married    Spouse name: N/A  . Number of children: N/A  . Years of education: N/A   Occupational History  . Not on file.   Social History Main Topics  . Smoking status: Never Smoker  . Smokeless tobacco: Never Used  . Alcohol use Yes     Comment: 2 drinks per week  . Drug use: No  . Sexual activity: Yes    Birth control/ protection: Condom   Other Topics Concern  . Not on file   Social History Narrative  . No narrative on file    Review of Systems:    Constitutional: No weight loss, fever or chills Skin: No rash  Cardiovascular: No chest pain Respiratory: No SOB  Gastrointestinal: See HPI and otherwise negative Genitourinary: No dysuria  Neurological: No headache Musculoskeletal: No new muscle or joint pain Hematologic: No bleeding Psychiatric: No history of depression or anxiety   Physical Exam:  Vital signs: BP 110/70  Pulse 80   Ht 5\' 4"  (1.626 m)   Wt 134 lb 9.6 oz (61.1 kg)   BMI 23.10 kg/m   Constitutional:   Pleasant Caucasian female appears to be in NAD, Well developed, Well nourished, alert and cooperative Head:  Normocephalic and atraumatic. Eyes:   PEERL, EOMI. No icterus. Conjunctiva pink. Ears:  Normal auditory acuity. Neck:  Supple Throat: Oral cavity and pharynx without inflammation, swelling or lesion.  Respiratory: Respirations even and unlabored. Lungs clear to auscultation bilaterally.   No wheezes, crackles, or rhonchi.  Cardiovascular: Normal S1, S2. No MRG. Regular rate and rhythm. No peripheral edema, cyanosis or pallor.  Gastrointestinal:  Soft, nondistended, nontender. No rebound or guarding. Normal bowel sounds. No  appreciable masses or hepatomegaly. Rectal:  Not performed.  Msk:  Symmetrical without gross deformities. Without edema, no deformity or joint abnormality.  Neurologic:  Alert and  oriented x4;  grossly normal neurologically.  Skin:   Dry and intact without significant lesions or rashes. Psychiatric: Demonstrates good judgement and reason without abnormal affect or behaviors.  RELEVANT LABS AND IMAGING: CBC    Component Value Date/Time   WBC 3.9 02/25/2017 1122   RBC 4.52 02/25/2017 1122   HGB 11.3 (L) 02/25/2017 1122   HCT 35.0 02/25/2017 1122   PLT 300 02/25/2017 1122   MCV 77.4 (L) 02/25/2017 1122   MCH 25.0 (L) 02/25/2017 1122   MCHC 32.3 02/25/2017 1122   RDW 16.3 (H) 02/25/2017 1122   LYMPHSABS 1,248 02/25/2017 1122   MONOABS 507 02/25/2017 1122   EOSABS 39 02/25/2017 1122   BASOSABS 78 02/25/2017 1122    CMP     Component Value Date/Time   NA 139 02/19/2017 0956   K 3.7 02/19/2017 0956   CL 104 02/19/2017 0956   CO2 27 02/19/2017 0956   GLUCOSE 91 02/19/2017 0956   BUN 7 02/19/2017 0956   CREATININE 0.67 02/19/2017 0956   CALCIUM 9.3 02/19/2017 0956   PROT 7.9 02/19/2017 0956   ALBUMIN 4.5 02/19/2017 0956   AST 15 02/19/2017 0956   ALT 8 02/19/2017 0956   ALKPHOS 59 02/19/2017 0956   BILITOT 1.3 (H) 02/19/2017 0956   GFRNONAA >89 02/19/2017 0956   GFRAA >89 02/19/2017 0956    Assessment: 1. GERD: Patient reports two "elongated episodes" of reflux with an acid material in her mouth, this has not occurred since she changed her diet and lifestyle; likely related to reflux or gastritis 2. Epigastric pain: With above, though sometimes continues with epigastric pain, worse when she bends over, consider hiatal hernia 3. IDA: Recently diagnosed with a hemoglobin minimally low at 11.3, iron is also low, patient recently started on iron twice a day, suspect that this is due to her diet after our conversation  Plan: 1. Discussed with the patient that due to the fact  that she does not eat any iron at all and her hemoglobin is only minimally decreased, do not recommend further evaluation at this time as it is likely explained by her diet. If her iron and hemoglobin do not improve on an iron supplement over the next 3 weeks, then could discuss EGD and colonoscopy further. 2. Would recommend patient start Omeprazole 20 mg daily, 30-60 minutes before breakfast. She should continue this for the next 3-4 weeks. 3. We reviewed an antireflux diet and lifestyle modifications. She was provided with a handout. 4. Patient to follow in clinic in 3-4 weeks with myself or Dr. Silverio Decamp as she is supervising physician today.  If her iron remains low or she continues with epigastric pain, recommend that she have further evaluation.  Ellouise Newer, PA-C [facility] 03/07/2017, 12:01 PM  Cc: Emeterio Reeve, DO

## 2017-03-13 DIAGNOSIS — M9905 Segmental and somatic dysfunction of pelvic region: Secondary | ICD-10-CM | POA: Diagnosis not present

## 2017-03-13 DIAGNOSIS — M5383 Other specified dorsopathies, cervicothoracic region: Secondary | ICD-10-CM | POA: Diagnosis not present

## 2017-03-13 DIAGNOSIS — Q72812 Congenital shortening of left lower limb: Secondary | ICD-10-CM | POA: Diagnosis not present

## 2017-03-13 DIAGNOSIS — M9901 Segmental and somatic dysfunction of cervical region: Secondary | ICD-10-CM | POA: Diagnosis not present

## 2017-03-13 NOTE — Progress Notes (Signed)
Reviewed and agree with documentation and assessment and plan. K. Veena Merlyn Conley , MD   

## 2017-03-28 DIAGNOSIS — Q72812 Congenital shortening of left lower limb: Secondary | ICD-10-CM | POA: Diagnosis not present

## 2017-03-28 DIAGNOSIS — M5383 Other specified dorsopathies, cervicothoracic region: Secondary | ICD-10-CM | POA: Diagnosis not present

## 2017-03-28 DIAGNOSIS — M9901 Segmental and somatic dysfunction of cervical region: Secondary | ICD-10-CM | POA: Diagnosis not present

## 2017-03-28 DIAGNOSIS — M9905 Segmental and somatic dysfunction of pelvic region: Secondary | ICD-10-CM | POA: Diagnosis not present

## 2017-04-02 ENCOUNTER — Telehealth: Payer: Self-pay

## 2017-04-02 NOTE — Telephone Encounter (Signed)
Printed release and placed in folder for records handling

## 2017-04-02 NOTE — Telephone Encounter (Signed)
Pt is requesting that her medical information be sent to her life insurance company.  The release is in the media tab for you to send it.  Please advise.

## 2017-04-07 DIAGNOSIS — M9905 Segmental and somatic dysfunction of pelvic region: Secondary | ICD-10-CM | POA: Diagnosis not present

## 2017-04-07 DIAGNOSIS — M5383 Other specified dorsopathies, cervicothoracic region: Secondary | ICD-10-CM | POA: Diagnosis not present

## 2017-04-07 DIAGNOSIS — Q72812 Congenital shortening of left lower limb: Secondary | ICD-10-CM | POA: Diagnosis not present

## 2017-04-07 DIAGNOSIS — M9901 Segmental and somatic dysfunction of cervical region: Secondary | ICD-10-CM | POA: Diagnosis not present

## 2017-04-10 ENCOUNTER — Ambulatory Visit (INDEPENDENT_AMBULATORY_CARE_PROVIDER_SITE_OTHER): Payer: BLUE CROSS/BLUE SHIELD | Admitting: Gastroenterology

## 2017-04-10 ENCOUNTER — Encounter: Payer: Self-pay | Admitting: Gastroenterology

## 2017-04-10 VITALS — BP 114/80 | HR 80 | Ht 64.57 in | Wt 136.1 lb

## 2017-04-10 DIAGNOSIS — K219 Gastro-esophageal reflux disease without esophagitis: Secondary | ICD-10-CM

## 2017-04-10 DIAGNOSIS — R1110 Vomiting, unspecified: Secondary | ICD-10-CM

## 2017-04-10 DIAGNOSIS — R1013 Epigastric pain: Secondary | ICD-10-CM | POA: Diagnosis not present

## 2017-04-10 DIAGNOSIS — R12 Heartburn: Secondary | ICD-10-CM

## 2017-04-10 DIAGNOSIS — IMO0001 Reserved for inherently not codable concepts without codable children: Secondary | ICD-10-CM

## 2017-04-10 MED ORDER — RANITIDINE HCL 150 MG PO TABS: 150.0000 mg | ORAL_TABLET | Freq: Every day | ORAL | 3 refills | Status: DC

## 2017-04-10 MED ORDER — OMEPRAZOLE 20 MG PO CPDR: 20.0000 mg | DELAYED_RELEASE_CAPSULE | Freq: Every day | ORAL | 3 refills | Status: DC

## 2017-04-10 NOTE — Progress Notes (Signed)
Angelica Kelly    [id number]    Jun 10, 1976  Primary Care Physician:Natalie Sheppard Coil, DO  Referring Physician: Emeterio Reeve, [address] [place], [place] [address]  Chief complaint:  GERD  HPI: 41 year old female here for follow-up visit, was last seen by Ellouise Newer on 03/07/2017. She continues to have intermittent heartburn, epigastric discomfort, regurgitation and acid taste associated with burning sensation in the throat if she does not take omeprazole. Symptoms were improved when she was taking omeprazole 20 mg daily with only occasional breakthrough symptoms at nighttime. She discontinued PPI about 3 days ago with rebound symptoms, she is also having intermittent trouble swallowing especially when she gets the heartburn or epigastric discomfort and feels the food takes longer to go down. She is trying to change her diet and has decreased drinking wine, avoiding coffee and chocolate. Denies any nausea, vomiting, abdominal pain, melena or bright red blood per rectum    Outpatient Encounter Prescriptions as of 04/10/2017  Medication Sig  . docusate sodium (STOOL SOFTENER) 100 MG capsule Take 100 mg by mouth as needed for mild constipation.   . ferrous sulfate 325 (65 FE) MG EC tablet Take 1 tablet (325 mg total) by mouth 2 (two) times daily with a meal.  . [DISCONTINUED] omeprazole (PRILOSEC) 20 MG capsule Take 1 capsule (20 mg total) by mouth daily before breakfast.   No facility-administered encounter medications on file as of 04/10/2017.     Allergies as of 04/10/2017  . (No Known Allergies)    Past Medical History:  Diagnosis Date  . Iron deficiency anemia   . Vaginal Pap smear, abnormal     Past Surgical History:  Procedure Laterality Date  . APPENDECTOMY    . cervical cryo    . LEEP      Family History  Problem Relation Age of Onset  . Colon cancer Neg Hx   . Esophageal cancer Neg Hx   . AAA (abdominal aortic aneurysm) Neg  Hx     Social History   Social History  . Marital status: Married    Spouse name: N/A  . Number of children: N/A  . Years of education: N/A   Occupational History  . Not on file.   Social History Main Topics  . Smoking status: Never Smoker  . Smokeless tobacco: Never Used  . Alcohol use Yes     Comment: 2 drinks per week  . Drug use: No  . Sexual activity: Yes    Birth control/ protection: Condom   Other Topics Concern  . Not on file   Social History Narrative  . No narrative on file      Review of systems: Review of Systems  Constitutional: Negative for fever and chills.  HENT: Negative.   Eyes: Negative for blurred vision.  Respiratory: Negative for cough, shortness of breath and wheezing.   Cardiovascular: Negative for chest pain and palpitations.  Gastrointestinal: as per HPI Genitourinary: Negative for dysuria, urgency, frequency and hematuria.  Musculoskeletal: Negative for myalgias, back pain and joint pain.  Skin: Negative for itching and rash.  Neurological: Negative for dizziness, tremors, focal weakness, seizures and loss of consciousness.  Endo/Heme/Allergies: Negative for seasonal allergies.  Psychiatric/Behavioral: Negative for depression, suicidal ideas and hallucinations.  All other systems reviewed and are negative.   Physical Exam: Vitals:   04/10/17 0903  BP: 114/80  Pulse: 80   Body mass index is 22.96 kg/m. Gen:  No acute distress HEENT:  EOMI, sclera anicteric Neck:     No masses; no thyromegaly Lungs:    Clear to auscultation bilaterally; normal respiratory effort CV:         Regular rate and rhythm; no murmurs Abd:      + bowel sounds; soft, non-tender; no palpable masses, no distension Ext:    No edema; adequate peripheral perfusion Skin:      Warm and dry; no rash Neuro: alert and oriented x 3 Psych: normal mood and affect  Data Reviewed:  Reviewed labs, radiology imaging, old records and pertinent past GI work  up   Assessment and Plan/Recommendations:  41 year old female here for follow-up with complaints of  intermittent heartburn, epigastric discomfort, acid taste and regurgitation suggestive of gastroesophageal reflux disease She did have improvement with omeprazole 20 mg daily. Patient discontinued as she does not want to be on medication long-term I reassured patient and advised her to restart omeprazole 20 mg daily, 30 minutes before breakfast and add Zantac 150 mg at bedtime as needed She can try to wean off PPI in next 6-8 weeks when symptoms stable and continue H2 blocker as needed If continues to have persistent symptoms we'll consider EGD to evaluate and exclude esophagitis, peptic stricture, ulcer disease or a large hiatal hernia Discussed antireflux diet and lifestyle in detail Return in 2-3 months or sooner if needed  25 minutes was spent face-to-face with the patient. Greater than 50% of the time used for counseling as well as treatment plan and follow-up of GERD. She had multiple questions which were answered to her satisfaction  K. Denzil Magnuson , MD [phone] Mon-Fri 8a-5p [phone] after 5p, weekends, holidays  CC: Emeterio Reeve, DO

## 2017-04-10 NOTE — Patient Instructions (Signed)
We will schedule a possible endoscopy if no improvements, we already have given you blank printed endoscopy instructions in case you call back to schedule  We will send omeprazole and zantac to your pharmacy   Food Choices for Gastroesophageal Reflux Disease, Adult When you have gastroesophageal reflux disease (GERD), the foods you eat and your eating habits are very important. Choosing the right foods can help ease your discomfort. What guidelines do I need to follow?  Choose fruits, vegetables, whole grains, and low-fat dairy products.  Choose low-fat meat, fish, and poultry.  Limit fats such as oils, salad dressings, butter, nuts, and avocado.  Keep a food diary. This helps you identify foods that cause symptoms.  Avoid foods that cause symptoms. These may be different for everyone.  Eat small meals often instead of 3 large meals a day.  Eat your meals slowly, in a place where you are relaxed.  Limit fried foods.  Cook foods using methods other than frying.  Avoid drinking alcohol.  Avoid drinking large amounts of liquids with your meals.  Avoid bending over or lying down until 2-3 hours after eating. What foods are not recommended? These are some foods and drinks that may make your symptoms worse: Vegetables  Tomatoes. Tomato juice. Tomato and spaghetti sauce. Chili peppers. Onion and garlic. Horseradish. Fruits  Oranges, grapefruit, and lemon (fruit and juice). Meats  High-fat meats, fish, and poultry. This includes hot dogs, ribs, ham, sausage, salami, and bacon. Dairy  Whole milk and chocolate milk. Sour cream. Cream. Butter. Ice cream. Cream cheese. Drinks  Coffee and tea. Bubbly (carbonated) drinks or energy drinks. Condiments  Hot sauce. Barbecue sauce. Sweets/Desserts  Chocolate and cocoa. Donuts. Peppermint and spearmint. Fats and Oils  High-fat foods. This includes [place] fries and potato chips. Other  Vinegar. Strong spices. This includes black pepper,  white pepper, red pepper, cayenne, curry powder, cloves, ginger, and chili powder. The items listed above may not be a complete list of foods and drinks to avoid. Contact your dietitian for more information.  This information is not intended to replace advice given to you by your health care provider. Make sure you discuss any questions you have with your health care provider. Document Released: 06/02/2012 Document Revised: 05/09/2016 Document Reviewed: 10/06/2013 Elsevier Interactive Patient Education  2017 [place].    Gastroesophageal Reflux Disease, Adult Normally, food travels down the esophagus and stays in the stomach to be digested. If a person has gastroesophageal reflux disease (GERD), food and stomach acid move back up into the esophagus. When this happens, the esophagus becomes sore and swollen (inflamed). Over time, GERD can make small holes (ulcers) in the lining of the esophagus. Follow these instructions at home: Diet   Follow a diet as told by your doctor. You may need to avoid foods and drinks such as:  Coffee and tea (with or without caffeine).  Drinks that contain alcohol.  Energy drinks and sports drinks.  Carbonated drinks or sodas.  Chocolate and cocoa.  Peppermint and mint flavorings.  Garlic and onions.  Horseradish.  Spicy and acidic foods, such as peppers, chili powder, curry powder, vinegar, hot sauces, and BBQ sauce.  Citrus fruit juices and citrus fruits, such as oranges, lemons, and limes.  Tomato-based foods, such as red sauce, chili, salsa, and pizza with red sauce.  Fried and fatty foods, such as donuts, french fries, potato chips, and high-fat dressings.  High-fat meats, such as hot dogs, rib eye steak, sausage, ham, and bacon.  High-fat dairy items, such as whole milk, butter, and cream cheese.  Eat small meals often. Avoid eating large meals.  Avoid drinking large amounts of liquid with your meals.  Avoid eating meals during the  2-3 hours before bedtime.  Avoid lying down right after you eat.  Do not exercise right after you eat. General instructions   Pay attention to any changes in your symptoms.  Take over-the-counter and prescription medicines only as told by your doctor. Do not take aspirin, ibuprofen, or other NSAIDs unless your doctor says it is okay.  Do not use any tobacco products, including cigarettes, chewing tobacco, and e-cigarettes. If you need help quitting, ask your doctor.  Wear loose clothes. Do not wear anything tight around your waist.  Raise (elevate) the head of your bed about 6 inches (15 cm).  Try to lower your stress. If you need help doing this, ask your doctor.  If you are overweight, lose an amount of weight that is healthy for you. Ask your doctor about a safe weight loss goal.  Keep all follow-up visits as told by your doctor. This is important. Contact a doctor if:  You have new symptoms.  You lose weight and you do not know why it is happening.  You have trouble swallowing, or it hurts to swallow.  You have wheezing or a cough that keeps happening.  Your symptoms do not get better with treatment.  You have a hoarse voice. Get help right away if:  You have pain in your arms, neck, jaw, teeth, or back.  You feel sweaty, dizzy, or light-headed.  You have chest pain or shortness of breath.  You throw up (vomit) and your throw up looks like blood or coffee grounds.  You pass out (faint).  Your poop (stool) is bloody or black.  You cannot swallow, drink, or eat. This information is not intended to replace advice given to you by your health care provider. Make sure you discuss any questions you have with your health care provider. Document Released: 05/20/2008 Document Revised: 05/09/2016 Document Reviewed: 03/29/2015 Elsevier Interactive Patient Education  2017 [employer].

## 2017-04-22 DIAGNOSIS — M9905 Segmental and somatic dysfunction of pelvic region: Secondary | ICD-10-CM | POA: Diagnosis not present

## 2017-04-22 DIAGNOSIS — Q72812 Congenital shortening of left lower limb: Secondary | ICD-10-CM | POA: Diagnosis not present

## 2017-04-22 DIAGNOSIS — M5383 Other specified dorsopathies, cervicothoracic region: Secondary | ICD-10-CM | POA: Diagnosis not present

## 2017-04-22 DIAGNOSIS — M9901 Segmental and somatic dysfunction of cervical region: Secondary | ICD-10-CM | POA: Diagnosis not present

## 2018-05-20 ENCOUNTER — Encounter: Payer: BLUE CROSS/BLUE SHIELD | Admitting: Osteopathic Medicine

## 2018-06-25 ENCOUNTER — Encounter: Payer: BLUE CROSS/BLUE SHIELD | Admitting: Osteopathic Medicine

## 2018-07-27 ENCOUNTER — Encounter: Payer: Self-pay | Admitting: Osteopathic Medicine

## 2018-07-27 ENCOUNTER — Ambulatory Visit (INDEPENDENT_AMBULATORY_CARE_PROVIDER_SITE_OTHER): Payer: BLUE CROSS/BLUE SHIELD | Admitting: Osteopathic Medicine

## 2018-07-27 VITALS — BP 131/88 | HR 80 | Wt 140.0 lb

## 2018-07-27 DIAGNOSIS — Z1231 Encounter for screening mammogram for malignant neoplasm of breast: Secondary | ICD-10-CM

## 2018-07-27 DIAGNOSIS — Z1239 Encounter for other screening for malignant neoplasm of breast: Secondary | ICD-10-CM

## 2018-07-27 DIAGNOSIS — Z0000 Encounter for general adult medical examination without abnormal findings: Secondary | ICD-10-CM | POA: Diagnosis not present

## 2018-07-27 DIAGNOSIS — R8290 Unspecified abnormal findings in urine: Secondary | ICD-10-CM

## 2018-07-27 DIAGNOSIS — R3121 Asymptomatic microscopic hematuria: Secondary | ICD-10-CM | POA: Diagnosis not present

## 2018-07-27 MED ORDER — FERROUS SULFATE 325 (65 FE) MG PO TBEC: 325.0000 mg | DELAYED_RELEASE_TABLET | ORAL | 1 refills | Status: DC

## 2018-07-27 NOTE — Patient Instructions (Signed)
Review preventive care:   General Preventive Care  Most recent routine screening lipids/other labs:   Tobacco: don't! Alcohol: moderation is ok for most people. Recreational/Illicit Drugs: don't!  Exercise: as tolerated to reduce risk of cardiovascular disease and diabetes  Mental health: if need for mental health care (medicines, counseling, other), or concerns about moods, please let me know!    Vaccines  Flu vaccine: recommended every fall (by Halloween!)  Shingles vaccine: Shingrix recommended after age 61  Pneumonia vaccines: Prevnar and Pneumovax recommended after age 79  Tetanus booster: Tdap recommended every 10 years  Cancer screenings   Colon cancer screening: recommended at age 25, colonoscopy sooner if risk factors   Breast cancer screening: mammogram recommended at age 72, have ordered mammogram  Cervical cancer screening: every 1 to 5 years depending on age and other risk factors. Can schedule with OBGYN Dr. Hulan Fray   Infection screenings . HIV: recommended screening at least once age 41-65, more often if risk factors  . Gonorrhea/Chlamydia: otherwise screening as needed . Hepatitis C: recommended for anyone born 25-1966  Other . Bone Density Test: recommended for women at age 19 . Advanced Directive: Living Will and/or Healthcare Power of Attorney recommended for everyone, regardless of age or health . Cholesterol: recommended screening annually . Diabetes: recommended screening annually  . Thyroid and Vitamin D: routine screening not recommended, most insurance will not cover this test

## 2018-07-27 NOTE — Progress Notes (Signed)
HPI: Angelica Kelly is a 42 y.o. female who  has a past medical history of Iron deficiency anemia and Vaginal Pap smear, abnormal.  she presents to [facility] today, 07/27/18,  for chief complaint of: Annual Physical    Patient here for annual physical / wellness exam.  See preventive care reviewed as below.  Recent labs reviewed in detail with the patient.   Additional concerns today include:  GI issues better, med list updated Reports urine testing through insurance was abnormal, was told she needed a colonoscopy?     Past medical, surgical, social and family history reviewed:  Patient Active Problem List   Diagnosis Date Noted  . Cardiac risk counseling 02/27/2017  . Iron deficiency anemia 02/26/2017  . Epigastric pain 02/19/2017  . Chronic bilateral thoracic back pain 02/19/2017  . Chest discomfort 02/19/2017  . Chronic low back pain without sciatica 10/08/2016  . Annual physical exam 10/08/2016    Past Surgical History:  Procedure Laterality Date  . APPENDECTOMY    . cervical cryo    . LEEP      Social History   Tobacco Use  . Smoking status: Never Smoker  . Smokeless tobacco: Never Used  Substance Use Topics  . Alcohol use: Yes    Comment: 2 drinks per week    Family History  Problem Relation Age of Onset  . Colon cancer Neg Hx   . Esophageal cancer Neg Hx   . AAA (abdominal aortic aneurysm) Neg Hx      Current medication list and allergy/intolerance information reviewed:    Current Outpatient Medications  Medication Sig Dispense Refill  . ferrous sulfate 325 (65 FE) MG EC tablet Take 1 tablet (325 mg total) by mouth every other day. 180 tablet 1   No current facility-administered medications for this visit.     No Known Allergies    Review of Systems:  Constitutional:  No  fever, no chills, No recent illness, No unintentional weight changes. No significant fatigue.   HEENT: No  headache, no vision change,  no hearing change, No sore throat, No  sinus pressure  Cardiac: No  chest pain, No  pressure, No palpitations, No  Orthopnea  Respiratory:  No  shortness of breath. No  Cough  Gastrointestinal: No  abdominal pain, No  nausea, No  vomiting,  No  blood in stool, No  diarrhea, No  constipation   Musculoskeletal: No new myalgia/arthralgia  Skin: No  Rash, No other wounds/concerning lesions  Genitourinary: No  incontinence, No  abnormal genital bleeding, No abnormal genital discharge  Hem/Onc: No  easy bruising/bleeding, No  abnormal lymph node  Endocrine: No cold intolerance,  No heat intolerance. No polyuria/polydipsia/polyphagia   Neurologic: No  weakness, No  dizziness, No  slurred speech/focal weakness/facial droop  Psychiatric: No  concerns with depression, No  concerns with anxiety, No sleep problems, No mood problems  Exam:  BP 131/88   Pulse 80   Wt 140 lb (63.5 kg)   BMI 23.61 kg/m   Constitutional: VS see above. General Appearance: alert, well-developed, well-nourished, NAD  Eyes: Normal lids and conjunctive, non-icteric sclera  Ears, Nose, Mouth, Throat: MMM, Normal external inspection ears/nares/mouth/lips/gums. TM normal bilaterally. Pharynx/tonsils no erythema, no exudate. Nasal mucosa normal.   Neck: No masses, trachea midline. No thyroid enlargement. No tenderness/mass appreciated. No lymphadenopathy  Respiratory: Normal respiratory effort. no wheeze, no rhonchi, no rales  Cardiovascular: S1/S2 normal, no murmur, no rub/gallop auscultated. RRR. No lower  extremity edema. Pedal pulse II/IV bilaterally DP and PT. No carotid bruit or JVD. No abdominal aortic bruit.  Gastrointestinal: Nontender, no masses. No hepatomegaly, no splenomegaly. No hernia appreciated. Bowel sounds normal. Rectal exam deferred.   Musculoskeletal: Gait normal. No clubbing/cyanosis of digits.   Neurological: Normal balance/coordination. No tremor. No cranial nerve deficit on limited exam.  Motor and sensation intact and symmetric. Cerebellar reflexes intact.   Skin: warm, dry, intact. No rash/ulcer. No concerning nevi or subq nodules on limited exam.    Psychiatric: Normal judgment/insight. Normal mood and affect. Oriented x3.      ASSESSMENT/PLAN: The primary encounter diagnosis was Annual physical exam. Diagnoses of Breast cancer screening and Abnormal result on screening urine test were also pertinent to this visit.  Orders Placed This Encounter  Procedures  . MM 3D SCREEN BREAST BILATERAL    Order Specific Question:   Reason for Exam (SYMPTOM  OR DIAGNOSIS REQUIRED)    Answer:   breast cancer screening    Order Specific Question:   Is the patient pregnant?    Answer:   No    Order Specific Question:   Preferred imaging location?    Answer:   [facility]  . CBC  . COMPLETE METABOLIC PANEL WITH GFR  . Lipid panel  . Urinalysis, Routine w reflex microscopic   Patient Instructions  Review preventive care:   General Preventive Care  Most recent routine screening lipids/other labs:   Tobacco: don't! Alcohol: moderation is ok for most people. Recreational/Illicit Drugs: don't!  Exercise: as tolerated to reduce risk of cardiovascular disease and diabetes  Mental health: if need for mental health care (medicines, counseling, other), or concerns about moods, please let me know!    Vaccines  Flu vaccine: recommended every fall (by Halloween!)  Shingles vaccine: Shingrix recommended after age 68  Pneumonia vaccines: Prevnar and Pneumovax recommended after age 31  Tetanus booster: Tdap recommended every 10 years  Cancer screenings   Colon cancer screening: recommended at age 80, colonoscopy sooner if risk factors   Breast cancer screening: mammogram recommended at age 5, have ordered mammogram  Cervical cancer screening: every 1 to 5 years depending on age and other risk factors. Can schedule with OBGYN Dr. Hulan Fray   Infection screenings . HIV:  recommended screening at least once age 13-65, more often if risk factors  . Gonorrhea/Chlamydia: otherwise screening as needed . Hepatitis C: recommended for anyone born 82-1966  Other . Bone Density Test: recommended for women at age 65 . Advanced Directive: Living Will and/or Healthcare Power of Attorney recommended for everyone, regardless of age or health . Cholesterol: recommended screening annually . Diabetes: recommended screening annually  . Thyroid and Vitamin D: routine screening not recommended, most insurance will not cover this test           Visit summary with medication list and pertinent instructions was printed for patient to review. All questions at time of visit were answered - patient instructed to contact office with any additional concerns. ER/RTC precautions were reviewed with the patient.   Follow-up plan: Return in about 1 year (around 07/28/2019) for annual checkup, sooner if needed.     Please note: voice recognition software was used to produce this document, and typos may escape review. Please contact Dr. Sheppard Coil for any needed clarifications.

## 2018-07-28 LAB — URINALYSIS, ROUTINE W REFLEX MICROSCOPIC
BILIRUBIN URINE: NEGATIVE
Bacteria, UA: NONE SEEN /HPF
Glucose, UA: NEGATIVE
Hyaline Cast: NONE SEEN /LPF
KETONES UR: NEGATIVE
LEUKOCYTES UA: NEGATIVE
NITRITE: NEGATIVE
PROTEIN: NEGATIVE
SPECIFIC GRAVITY, URINE: 1.007 (ref 1.001–1.03)
SQUAMOUS EPITHELIAL / LPF: NONE SEEN /HPF (ref <=5)
WBC, UA: NONE SEEN /HPF (ref 0–5)
pH: 8 (ref 5.0–8.0)

## 2018-07-28 LAB — CBC
HCT: 38.7 % (ref 35.0–45.0)
HEMOGLOBIN: 13.3 g/dL (ref 11.7–15.5)
MCH: 28.9 pg (ref 27.0–33.0)
MCHC: 34.4 g/dL (ref 32.0–36.0)
MCV: 84.1 fL (ref 80.0–100.0)
MPV: 10.1 fL (ref 7.5–12.5)
Platelets: 289 10*3/uL (ref 140–400)
RBC: 4.6 10*6/uL (ref 3.80–5.10)
RDW: 12.5 % (ref 11.0–15.0)
WBC: 5.1 10*3/uL (ref 3.8–10.8)

## 2018-07-28 LAB — COMPLETE METABOLIC PANEL WITH GFR
AG Ratio: 1.4 (calc) (ref 1.0–2.5)
ALBUMIN MSPROF: 4.5 g/dL (ref 3.6–5.1)
ALKALINE PHOSPHATASE (APISO): 59 U/L (ref 33–115)
ALT: 10 U/L (ref 6–29)
AST: 17 U/L (ref 10–30)
BILIRUBIN TOTAL: 1.4 mg/dL — AB (ref 0.2–1.2)
BUN: 7 mg/dL (ref 7–25)
CHLORIDE: 104 mmol/L (ref 98–110)
CO2: 27 mmol/L (ref 20–32)
CREATININE: 0.59 mg/dL (ref 0.50–1.10)
Calcium: 9.6 mg/dL (ref 8.6–10.2)
GFR, Est African American: 130 mL/min/{1.73_m2} (ref >=60)
GFR, Est Non African American: 112 mL/min/{1.73_m2} (ref >=60)
GLOBULIN: 3.2 g/dL (ref 1.9–3.7)
GLUCOSE: 91 mg/dL (ref 65–99)
POTASSIUM: 5 mmol/L (ref 3.5–5.3)
SODIUM: 139 mmol/L (ref 135–146)
Total Protein: 7.7 g/dL (ref 6.1–8.1)

## 2018-07-28 LAB — LIPID PANEL
CHOLESTEROL: 214 mg/dL — AB (ref <200)
HDL: 75 mg/dL (ref >50)
LDL Cholesterol (Calc): 123 mg/dL (calc) — ABNORMAL HIGH
Non-HDL Cholesterol (Calc): 139 mg/dL (calc) — ABNORMAL HIGH (ref <130)
Total CHOL/HDL Ratio: 2.9 (calc) (ref <5.0)
Triglycerides: 68 mg/dL (ref <150)

## 2018-07-29 DIAGNOSIS — R3121 Asymptomatic microscopic hematuria: Secondary | ICD-10-CM | POA: Insufficient documentation

## 2018-07-29 NOTE — Addendum Note (Signed)
Addended by: Maryla Morrow on: 07/29/2018 09:45 AM   Modules accepted: Orders

## 2018-07-30 MED ORDER — CEPHALEXIN 500 MG PO CAPS: 500.0000 mg | ORAL_CAPSULE | Freq: Two times a day (BID) | ORAL | 0 refills | Status: DC

## 2018-07-30 MED ORDER — FLUCONAZOLE 150 MG PO TABS: 150.0000 mg | ORAL_TABLET | Freq: Once | ORAL | 1 refills | Status: AC

## 2018-07-30 NOTE — Addendum Note (Signed)
Addended by: Maryla Morrow on: 07/30/2018 12:12 PM   Modules accepted: Orders

## 2018-08-12 ENCOUNTER — Ambulatory Visit: Payer: BLUE CROSS/BLUE SHIELD

## 2018-08-13 ENCOUNTER — Encounter: Payer: Self-pay | Admitting: Osteopathic Medicine

## 2018-08-13 ENCOUNTER — Other Ambulatory Visit (HOSPITAL_COMMUNITY)
Admission: RE | Admit: 2018-08-13 | Discharge: 2018-08-13 | Disposition: A | Discharge status: Home / Self Care | Payer: BLUE CROSS/BLUE SHIELD | Source: Ambulatory Visit | Attending: Osteopathic Medicine | Admitting: Osteopathic Medicine

## 2018-08-13 ENCOUNTER — Ambulatory Visit: Payer: BLUE CROSS/BLUE SHIELD

## 2018-08-13 ENCOUNTER — Ambulatory Visit: Payer: BLUE CROSS/BLUE SHIELD | Admitting: Obstetrics & Gynecology

## 2018-08-13 ENCOUNTER — Ambulatory Visit (INDEPENDENT_AMBULATORY_CARE_PROVIDER_SITE_OTHER): Payer: BLUE CROSS/BLUE SHIELD | Admitting: Osteopathic Medicine

## 2018-08-13 ENCOUNTER — Ambulatory Visit (INDEPENDENT_AMBULATORY_CARE_PROVIDER_SITE_OTHER): Payer: BLUE CROSS/BLUE SHIELD

## 2018-08-13 VITALS — BP 146/93 | HR 83 | Temp 98.6°F | Wt 140.8 lb

## 2018-08-13 DIAGNOSIS — Z124 Encounter for screening for malignant neoplasm of cervix: Secondary | ICD-10-CM | POA: Diagnosis not present

## 2018-08-13 DIAGNOSIS — N898 Other specified noninflammatory disorders of vagina: Secondary | ICD-10-CM

## 2018-08-13 DIAGNOSIS — Z1231 Encounter for screening mammogram for malignant neoplasm of breast: Secondary | ICD-10-CM

## 2018-08-13 LAB — POCT URINALYSIS DIPSTICK
Bilirubin, UA: NEGATIVE
GLUCOSE UA: NEGATIVE
Ketones, UA: NEGATIVE
LEUKOCYTES UA: NEGATIVE
Nitrite, UA: NEGATIVE
PROTEIN UA: NEGATIVE
Spec Grav, UA: 1.02 (ref 1.010–1.025)
Urobilinogen, UA: 0.2 E.U./dL
pH, UA: 7 (ref 5.0–8.0)

## 2018-08-13 LAB — WET PREP FOR TRICH, YEAST, CLUE
MICRO NUMBER: 91035433
SPECIMEN QUALITY 3963: ADEQUATE

## 2018-08-13 NOTE — Patient Instructions (Signed)
   I think your symptoms are just part of your normal cycle  Will test the urine for infection, blood  Will test vaginal discharge for yeast, BV  If symptoms really bother you, we can restart a birth control pill or send you to an OBGYN if needed

## 2018-08-13 NOTE — Progress Notes (Signed)
HPI: Angelica Kelly is a 42 y.o. female who  has a past medical history of Iron deficiency anemia and Vaginal Pap smear, abnormal.  she presents to [facility] today, 08/13/18,  for chief complaint of:  Vaginal/Urinary concern   Pelvic pressure and itching. thinks has to do with ovulation. Yesterday was about 13 days after 1st day LMP, she usually gets some clear vaginal discharge around that time then some lower abdominal pain. She wants to know if this is normal. Never used to bother her when she was on birth control pills.    Past medical history, surgical history, and family history reviewed.  Current medication Kelly and allergy/intolerance information reviewed.   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  Patient and I discussed follow-up with OB/GYN, could consider surgery if this really bothers her.  Vaginal discharge - Plan: WET PREP FOR Grandfather, YEAST, CLUE  Cervical cancer screening - Plan: Cytology - PAP     Patient Instructions   I think your symptoms are just part of your normal cycle  Will test the urine for infection, blood  Will test vaginal discharge for yeast, BV  If symptoms really bother you, we can restart a birth control pill or send you to an OBGYN if needed    Follow-up plan: Return if symptoms worsen or fail to improve.               ############################################ ############################################ ############################################ ############################################    Outpatient Encounter Medications as of 08/13/2018  Medication Sig  . ferrous sulfate 325 (65 FE) MG EC tablet Take 1 tablet (325 mg total) by mouth every other day.  . cephALEXin (KEFLEX) 500 MG  capsule Take 1 capsule (500 mg total) by mouth 2 (two) times daily. (Patient not taking: Reported on 08/13/2018)   No facility-administered encounter medications on file as of 08/13/2018.    No Known Allergies    Review of Systems:  Constitutional: No recent illness  HEENT: No  headache, no vision change  Cardiac: No  chest pain,   Respiratory:  No  shortness of breath.  Gastrointestinal: No  abdominal pain, no change on bowel habits  Neurologic: No  weakness, No  Dizziness   Exam:  BP (!) 146/93 (BP Location: Left Arm, Patient Position: Sitting, Cuff Size: Normal)   Pulse 83   Temp 98.6 F (37 C) (Oral)   Wt 140 lb 12.8 oz (63.9 kg)   BMI 23.75 kg/m   Constitutional: VS see above. General Appearance: alert, well-developed, well-nourished, NAD  Eyes: Normal lids and conjunctive, non-icteric sclera  Ears, Nose, Mouth, Throat: MMM, Normal external inspection ears/nares/mouth/lips/gums.  Neck: No masses, trachea midline.   Respiratory: Normal respiratory effort.    Musculoskeletal: Gait normal. Symmetric and independent movement of all extremities  Neurological: Normal balance/coordination. No tremor.  Skin: warm, dry, intact.   Psychiatric: Normal judgment/insight. Normal mood and affect. Oriented x3.  GYN: No lesions/ulcers to external genitalia, normal urethra, normal vaginal mucosa, physiologic discharge, cervix normal with small cyst at 11:00, uterus not enlarged or tender, adnexa no masses and nontender, pt reports feullness feeling with digital plapation of anterior vagina   Visit summary with medication Kelly and pertinent instructions was printed for patient to review, advised to alert [place] if any changes needed. All  questions at time of visit were answered - patient instructed to contact office with any additional concerns. ER/RTC precautions were reviewed with the patient and understanding verbalized.   Follow-up plan: Return if symptoms worsen or fail to  improve.   Please note: voice recognition software was used to produce this document, and typos may escape review. Please contact Dr. Sheppard Coil for any needed clarifications.

## 2018-08-13 NOTE — Addendum Note (Signed)
Addended by: Mertha Finders on: 08/13/2018 03:32 PM   Modules accepted: Orders

## 2018-08-18 LAB — CYTOLOGY - PAP
DIAGNOSIS: NEGATIVE
HPV (WINDOPATH): NOT DETECTED

## 2020-07-24 ENCOUNTER — Other Ambulatory Visit: Payer: Self-pay

## 2020-07-24 ENCOUNTER — Encounter: Payer: Self-pay | Admitting: Osteopathic Medicine

## 2020-07-24 ENCOUNTER — Ambulatory Visit (INDEPENDENT_AMBULATORY_CARE_PROVIDER_SITE_OTHER): Payer: BC Managed Care – PPO | Admitting: Osteopathic Medicine

## 2020-07-24 VITALS — BP 132/93 | HR 85 | Wt 139.0 lb

## 2020-07-24 DIAGNOSIS — D1801 Hemangioma of skin and subcutaneous tissue: Secondary | ICD-10-CM | POA: Diagnosis not present

## 2020-07-24 DIAGNOSIS — Z7189 Other specified counseling: Secondary | ICD-10-CM | POA: Diagnosis not present

## 2020-07-24 DIAGNOSIS — Z7185 Encounter for immunization safety counseling: Secondary | ICD-10-CM

## 2020-07-24 NOTE — Progress Notes (Signed)
Cherree Conerly is a 44 y.o. female who presents to  [place] [facility]  today, 07/24/20, seeking care for the following:  . Skin problem - red dots  . Concern for COVID vaccine - wants to get it, trying to convince her daughter to go with her.      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 A diagnosis of Vaccine counseling was also pertinent to this visit.   Advised COVID vaccination - risks < benefits  Labs for upcoming appointment      Follow-up instructions: Return for [place].                                         BP (!) 132/93 (BP Location: Left Arm)   Pulse 85   Wt 139 lb (63 kg)   SpO2 100%   BMI 23.44 kg/m   No outpatient medications have been marked as taking for the 07/24/20 encounter (Office Visit) with Emeterio Reeve, DO.    No results found for this or any previous visit (from the past 72 hour(s)).  No results found.     All questions at time of visit were answered - patient instructed to contact office with any additional concerns or updates.  ER/RTC precautions were reviewed with the patient as applicable.   Please note: voice recognition software was used to produce this document, and typos may escape review. Please contact Dr. Sheppard Coil for any needed clarifications.   Total encounter time: 20 minutes.

## 2020-08-09 ENCOUNTER — Encounter: Payer: Self-pay | Admitting: Osteopathic Medicine

## 2020-08-09 ENCOUNTER — Ambulatory Visit (INDEPENDENT_AMBULATORY_CARE_PROVIDER_SITE_OTHER): Payer: BC Managed Care – PPO | Admitting: Osteopathic Medicine

## 2020-08-09 VITALS — BP 135/81 | HR 78 | Temp 97.0°F | Wt 135.0 lb

## 2020-08-09 DIAGNOSIS — Z0000 Encounter for general adult medical examination without abnormal findings: Secondary | ICD-10-CM

## 2020-08-09 DIAGNOSIS — D508 Other iron deficiency anemias: Secondary | ICD-10-CM

## 2020-08-09 DIAGNOSIS — Z1231 Encounter for screening mammogram for malignant neoplasm of breast: Secondary | ICD-10-CM | POA: Diagnosis not present

## 2020-08-09 DIAGNOSIS — Z1211 Encounter for screening for malignant neoplasm of colon: Secondary | ICD-10-CM | POA: Diagnosis not present

## 2020-08-09 NOTE — Progress Notes (Signed)
Angelica Kelly is a 44 y.o. female who presents to  [facility]  today, 08/09/20, seeking care for the following:  . Annual physical      ASSESSMENT & PLAN with other pertinent findings:  The primary encounter diagnosis was Annual physical exam. A diagnosis of Other iron deficiency anemia was also pertinent to this visit.   No results found for this or any previous visit (from the past 24 hour(s)).   Patient Instructions  General Preventive Care  Most recent routine screening labs: ordered today.   Blood pressure goal 130/80 or less.   Tobacco: don't!  Alcohol: responsible moderation is ok for most adults - if you have concerns about your alcohol intake, please talk to me!   Exercise: as tolerated to reduce risk of cardiovascular disease and diabetes. Strength training will also prevent osteoporosis.   Mental health: if need for mental health care (medicines, counseling, other), or concerns about moods, please let me know!   Sexual / Reproductive health: if need for STD testing, or if concerns with libido/pain problems, please let me know!  Advanced Directive: Living Will and/or Healthcare Power of Attorney recommended for all adults, regardless of age or health.  Vaccines  Flu vaccine: for almost everyone, every fall.   Shingles vaccine: after age 12.   Pneumonia vaccines: after age 15.  Tetanus booster: every 10 years  COVID vaccine: STRONGLY RECOMMENDED  Cancer screenings   Colon cancer screening: for everyone age 4-75. Colonoscopy ok for everyone, many people also qualify for the Cologuard stool test   Breast cancer screening: mammogram due - ordered  Cervical cancer screening: Pap due 07/2023.   Lung cancer screening: not needed for non-smokers  Infection screenings  . HIV: recommended screening at least once age 72-65, more often as needed. . Gonorrhea/Chlamydia: screening as needed . Hepatitis C:  recommended once for everyone age 28-75 . TB: certain at-risk populations, or depending on work requirements and/or travel history Other . Bone Density Test: recommended for women at age 69   No orders of the defined types were placed in this encounter.   No orders of the defined types were placed in this encounter.  Constitutional:  . VSS, see nurse notes . General Appearance: alert, well-developed, well-nourished, NAD Eyes: Marland Kitchen Normal lids and conjunctive, non-icteric sclera . PERRLA Ears, Nose, Mouth, Throat: . Normal appearance . Normal external auditory canal and TM bilaterally Neck: . No masses, trachea midline . No thyroid enlargement/tenderness/mass appreciated Respiratory: . Normal respiratory effort . Breath sounds normal, no wheeze/rhonchi/rales Cardiovascular: . S1/S2 normal, no murmur/rub/gallop auscultated . No lower extremity edema Gastrointestinal: . Nontender, no masses . No hepatomegaly, no splenomegaly . No hernia appreciated Musculoskeletal:  . Gait normal . No clubbing/cyanosis of digits Neurological: . No cranial nerve deficit on limited exam . Motor and sensation intact and symmetric Psychiatric: . Normal judgment/insight . Normal mood and affect     Follow-up instructions: Return in about 1 year (around 08/09/2021) for [place] (call week prior to visit for lab orders).                                         There were no vitals taken for this visit.  No outpatient medications have been marked as taking for the 08/09/20 encounter (Office Visit) with Emeterio Reeve, DO.    No results found for  this or any previous visit (from the past 72 hour(s)).  No results found.     All questions at time of visit were answered - patient instructed to contact office with any additional concerns or updates.  ER/RTC precautions were reviewed with the patient as applicable.   Please note: voice recognition  software was used to produce this document, and typos may escape review. Please contact Dr. Sheppard Coil for any needed clarifications.

## 2020-08-09 NOTE — Patient Instructions (Addendum)
General Preventive Care  Most recent routine screening labs: ordered today.   Blood pressure goal 130/80 or less.   Tobacco: don't!  Alcohol: responsible moderation is ok for most adults - if you have concerns about your alcohol intake, please talk to me!   Exercise: as tolerated to reduce risk of cardiovascular disease and diabetes. Strength training will also prevent osteoporosis.   Mental health: any concerns, please let me know!   Sexual / Reproductive health: if need for STD testing, or if concerns with libido/pain problems or unusual bleeding, please let me know!  Advanced Directive: Living Will and/or Healthcare Power of Attorney recommended for all adults, regardless of age or health.  Vaccines  Flu vaccine: for almost everyone, every fall.   Shingles vaccine: after age 5.   Pneumonia vaccines: after age 75.  Tetanus booster: every 10 years  COVID vaccine: STRONGLY RECOMMENDED  Cancer screenings   Colon cancer screening: for everyone age 65-75. Colonoscopy ok for everyone, many people also qualify for the Cologuard stool test   Breast cancer screening: mammogram due - ordered  Cervical cancer screening: Pap due 07/2023.   Lung cancer screening: not needed for non-smokers  Infection screenings  . HIV: recommended screening at least once age 67-65, more often as needed. . Gonorrhea/Chlamydia: screening as needed . Hepatitis C: recommended once for everyone age 57-75 . TB: certain at-risk populations Other . Bone Density Test: recommended for women at age 72

## 2020-08-10 LAB — COMPLETE METABOLIC PANEL WITH GFR
AG Ratio: 1.5 (calc) (ref 1.0–2.5)
ALT: 12 U/L (ref 6–29)
AST: 18 U/L (ref 10–35)
Albumin: 4.7 g/dL (ref 3.6–5.1)
Alkaline phosphatase (APISO): 49 U/L (ref 31–125)
BUN: 7 mg/dL (ref 7–25)
CO2: 30 mmol/L (ref 20–32)
Calcium: 9.9 mg/dL (ref 8.6–10.2)
Chloride: 102 mmol/L (ref 98–110)
Creat: 0.69 mg/dL (ref 0.50–1.10)
GFR, Est African American: 122 mL/min/{1.73_m2} (ref >=60)
GFR, Est Non African American: 105 mL/min/{1.73_m2} (ref >=60)
Globulin: 3.1 g/dL (calc) (ref 1.9–3.7)
Glucose, Bld: 100 mg/dL — ABNORMAL HIGH (ref 65–99)
Potassium: 4.5 mmol/L (ref 3.5–5.3)
Sodium: 140 mmol/L (ref 135–146)
Total Bilirubin: 1.1 mg/dL (ref 0.2–1.2)
Total Protein: 7.8 g/dL (ref 6.1–8.1)

## 2020-08-10 LAB — CBC
HCT: 38 % (ref 35.0–45.0)
Hemoglobin: 12.8 g/dL (ref 11.7–15.5)
MCH: 29.2 pg (ref 27.0–33.0)
MCHC: 33.7 g/dL (ref 32.0–36.0)
MCV: 86.6 fL (ref 80.0–100.0)
MPV: 10.5 fL (ref 7.5–12.5)
Platelets: 349 10*3/uL (ref 140–400)
RBC: 4.39 10*6/uL (ref 3.80–5.10)
RDW: 12.9 % (ref 11.0–15.0)
WBC: 4.6 10*3/uL (ref 3.8–10.8)

## 2020-08-10 LAB — LIPID PANEL
Cholesterol: 206 mg/dL — ABNORMAL HIGH (ref <200)
HDL: 64 mg/dL (ref >=50)
LDL Cholesterol (Calc): 122 mg/dL (calc) — ABNORMAL HIGH
Non-HDL Cholesterol (Calc): 142 mg/dL (calc) — ABNORMAL HIGH (ref <130)
Total CHOL/HDL Ratio: 3.2 (calc) (ref <5.0)
Triglycerides: 98 mg/dL (ref <150)

## 2020-08-10 LAB — IRON,TIBC AND FERRITIN PANEL
%SAT: 20 % (calc) (ref 16–45)
Ferritin: 18 ng/mL (ref 16–232)
Iron: 75 ug/dL (ref 40–190)
TIBC: 383 mcg/dL (calc) (ref 250–450)

## 2020-08-24 ENCOUNTER — Ambulatory Visit (INDEPENDENT_AMBULATORY_CARE_PROVIDER_SITE_OTHER): Payer: BC Managed Care – PPO

## 2020-08-24 ENCOUNTER — Other Ambulatory Visit: Payer: Self-pay

## 2020-08-24 DIAGNOSIS — Z1231 Encounter for screening mammogram for malignant neoplasm of breast: Secondary | ICD-10-CM

## 2020-10-19 DIAGNOSIS — H2513 Age-related nuclear cataract, bilateral: Secondary | ICD-10-CM | POA: Diagnosis not present

## 2020-12-08 IMAGING — MG DIGITAL SCREENING BILAT W/ TOMO W/ CAD
6 of 10 series · 6 of 30 positions shown · non-contrast
Comparison: Previous exam(s).

CLINICAL DATA: Screening.

EXAM:
DIGITAL SCREENING BILATERAL MAMMOGRAM WITH TOMO AND CAD

[L MLO synth-2D]
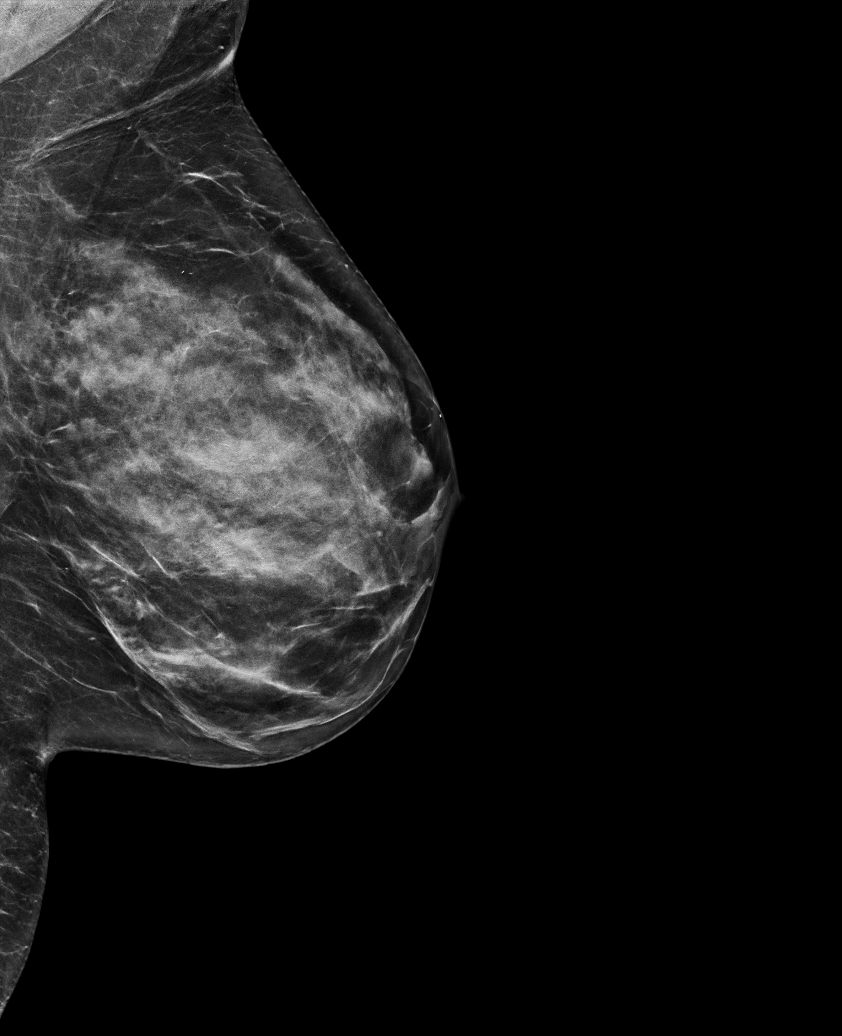

[L CC synth-2D]
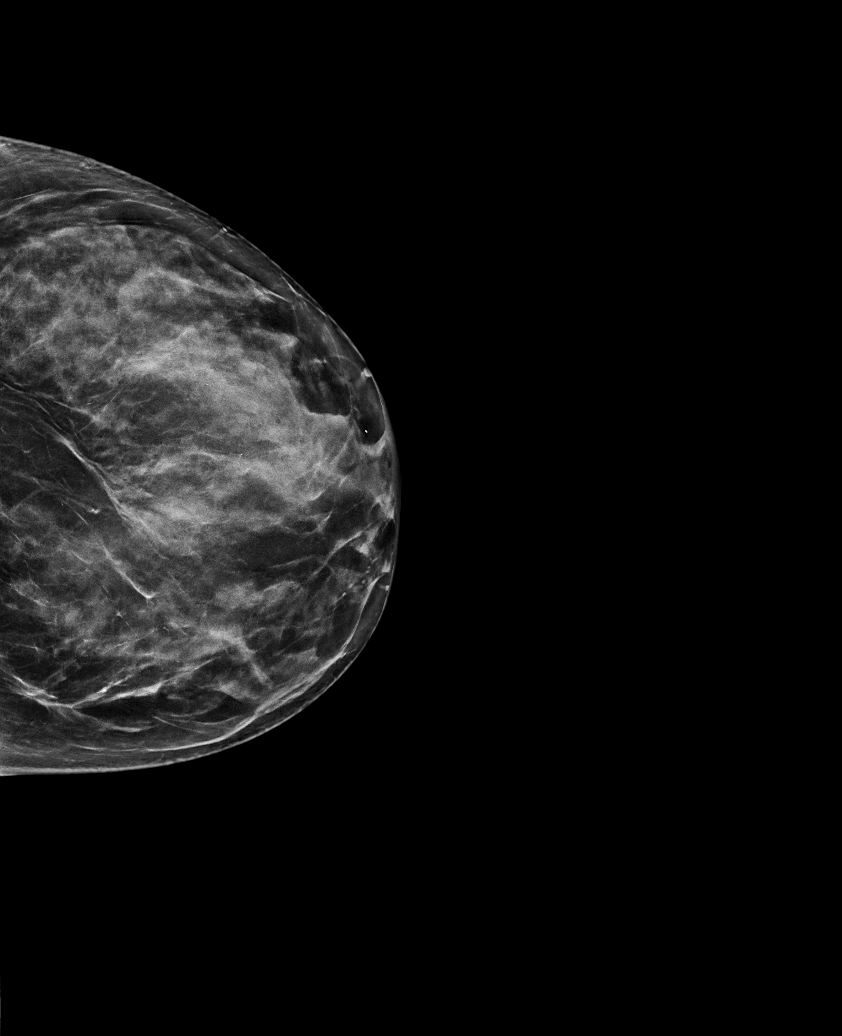

[R MLO synth-2D]
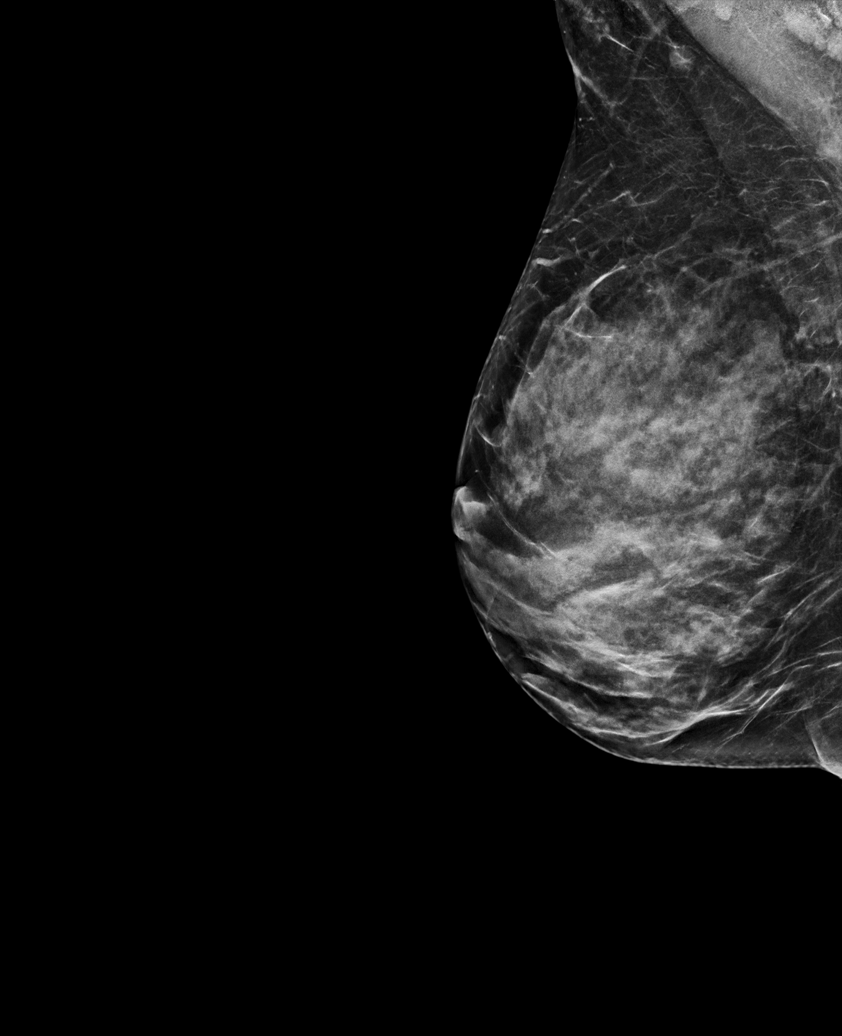

[R XCCL synth-2D]
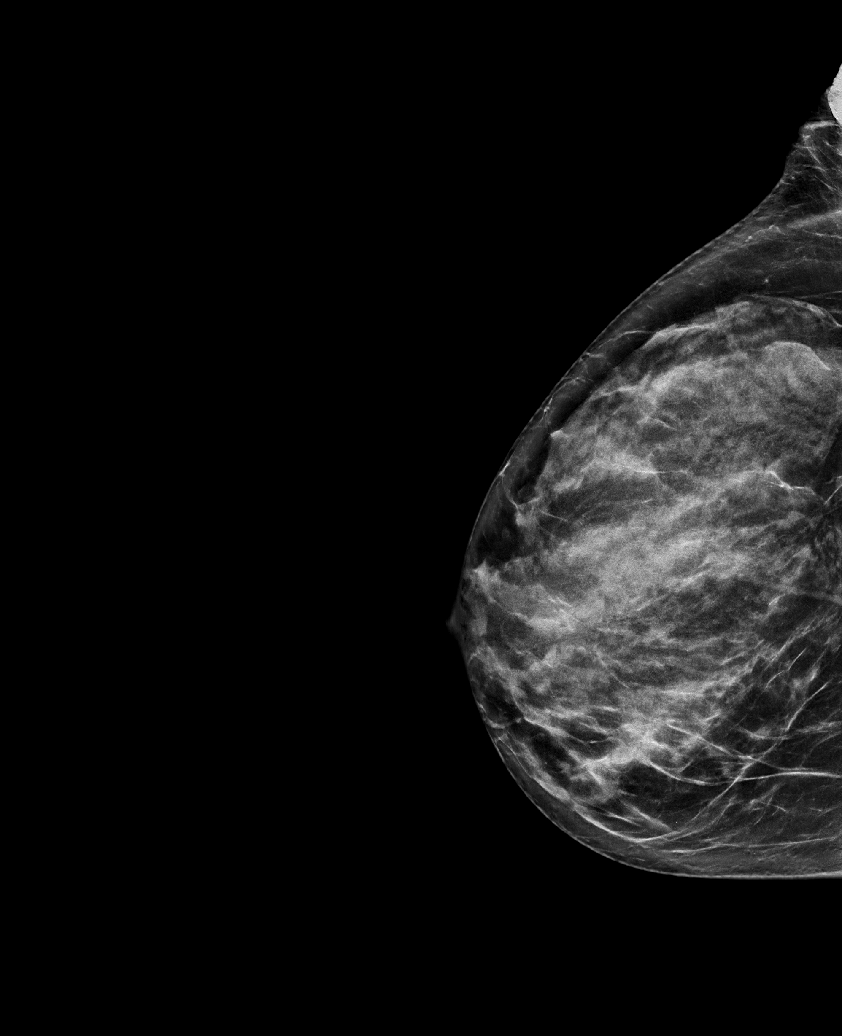

[R CC synth-2D]
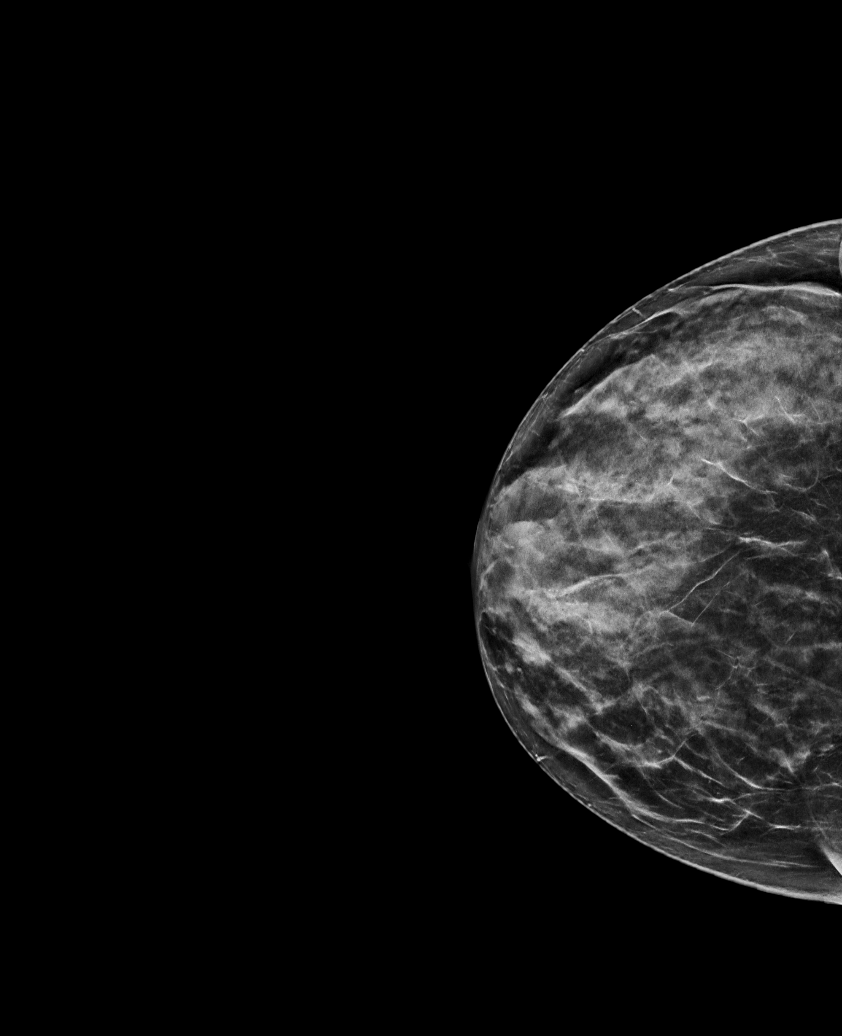

[L MLO tomo · tomo slice 33/66.0]
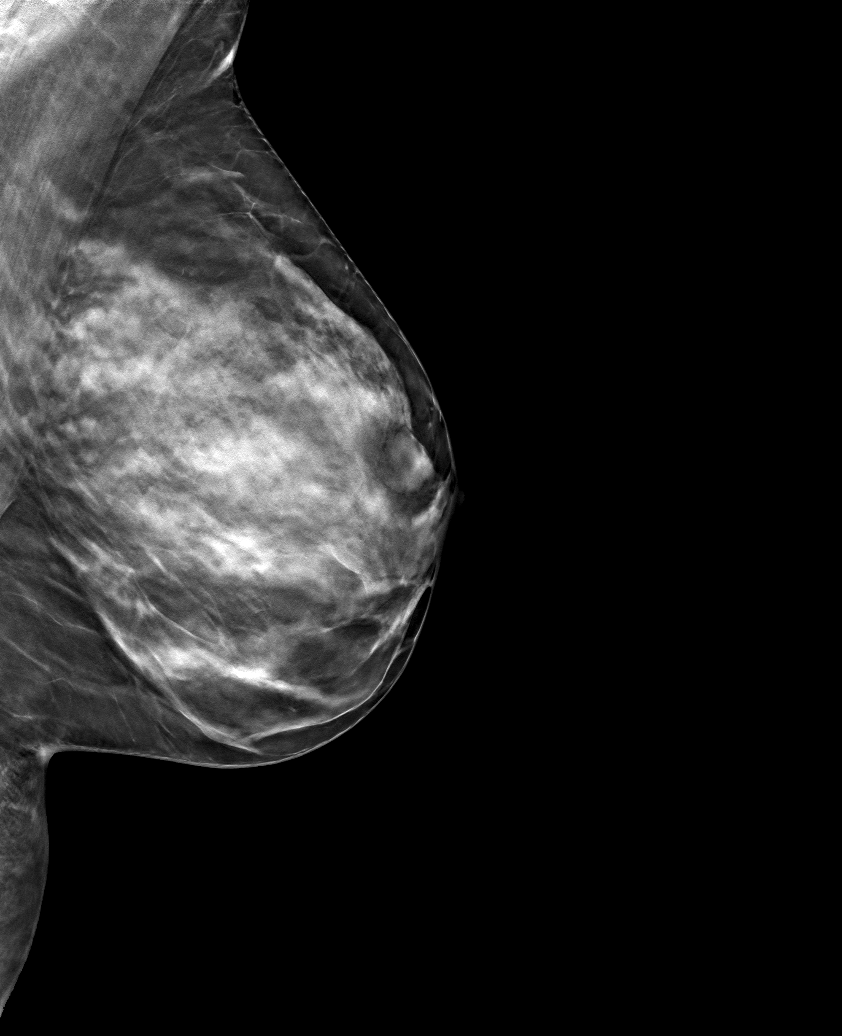

[6 of 30 positions shown; findings below may reference images not displayed]

ACR Breast Density Category d: The breast tissue is extremely dense,
which lowers the sensitivity of mammography
FINDINGS: There are no findings suspicious for malignancy. Images were
processed with CAD.
IMPRESSION: No mammographic evidence of malignancy. A result letter of this
screening mammogram will be mailed directly to the patient.

RECOMMENDATION:
Screening mammogram in one year. (Code:[id number])

BI-RADS CATEGORY  1: Negative.

## 2021-08-15 ENCOUNTER — Encounter: Payer: BC Managed Care – PPO | Admitting: Osteopathic Medicine

## 2021-09-06 ENCOUNTER — Encounter: Payer: BC Managed Care – PPO | Admitting: Osteopathic Medicine

## 2021-11-30 ENCOUNTER — Encounter: Payer: Self-pay | Admitting: Family Medicine

## 2021-11-30 ENCOUNTER — Ambulatory Visit (INDEPENDENT_AMBULATORY_CARE_PROVIDER_SITE_OTHER): Payer: BC Managed Care – PPO | Admitting: Family Medicine

## 2021-11-30 ENCOUNTER — Other Ambulatory Visit: Payer: Self-pay

## 2021-11-30 VITALS — BP 134/87 | HR 77 | Temp 98.2°F | Wt 135.1 lb

## 2021-11-30 DIAGNOSIS — Z23 Encounter for immunization: Secondary | ICD-10-CM | POA: Diagnosis not present

## 2021-11-30 DIAGNOSIS — Z1211 Encounter for screening for malignant neoplasm of colon: Secondary | ICD-10-CM | POA: Diagnosis not present

## 2021-11-30 DIAGNOSIS — Z0000 Encounter for general adult medical examination without abnormal findings: Secondary | ICD-10-CM | POA: Diagnosis not present

## 2021-11-30 NOTE — Progress Notes (Signed)
BP 134/87 (BP Location: Left Arm, Patient Position: Sitting, Cuff Size: Normal)    Pulse 77    Temp 98.2 F (36.8 C) (Oral)    Wt 135 lb 1.9 oz (61.3 kg)    BMI 22.79 kg/m    Subjective:    Patient ID: Angelica Kelly, female    DOB: August 01, 1976, 45 y.o.   MRN: [id number]  HPI: Angelica Kelly is a 45 y.o. female presenting on 11/30/2021 for comprehensive medical examination. 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 - 3 servings per month She denies nictoine use. She denies illegal substance use.    She reports regular menstrual periods with normal flow. Current menopausal symptoms: no She is not currently  sexually active  She denies  concerns today about STI Contraception choices are: none  She denies concerns about skin changes today. She denies concerns about bowel changes today. She denies concerns about bladder changes today.   Depression Screen done today and results listed below:  Depression screen [facility] 2/9 11/30/2021 08/09/2020 07/27/2018 02/19/2017  Decreased Interest 1 1 0 1  Down, Depressed, Hopeless 0 1 1 1   PHQ - 2 Score 1 2 1 2     She does not have a history of falls.       Past Medical History:  Past Medical History:  Diagnosis Date   Iron deficiency anemia    Vaginal Pap smear, abnormal     Surgical History:  Past Surgical History:  Procedure Laterality Date   APPENDECTOMY     cervical cryo     LEEP      Medications:  No current outpatient medications on file prior to visit.   No current facility-administered medications on file  prior to visit.    Allergies:  No Known Allergies  Social History:  Social History   Socioeconomic History   Marital status: Married    Spouse name: Not on file   Number of children: Not on file   Years of education: Not on file   Highest education level: Not on file  Occupational History   Not on file  Tobacco Use   Smoking status: Never   Smokeless tobacco: Never  Substance and Sexual Activity   Alcohol use: Yes    Comment: 2 drinks per week   Drug use: No   Sexual activity: Yes    Birth control/protection: Condom  Other Topics Concern   Not on file  Social History Narrative   Not on file   Social Determinants of Health   Financial Resource Strain: Not on file  Food Insecurity: Not on file  Transportation Needs: Not on file  Physical Activity: Not on file  Stress: Not on file  Social Connections: Not on file  Intimate Partner Violence: Not on file   Social History   Tobacco Use  Smoking Status Never  Smokeless Tobacco Never   Social History   Substance and Sexual Activity  Alcohol Use Yes   Comment: 2 drinks per week    Family History:  Family History  Problem Relation Age of Onset  Colon cancer Neg Hx    Esophageal cancer Neg Hx    AAA (abdominal aortic aneurysm) Neg Hx     Past medical history, surgical history, medications, allergies, family history and social history reviewed with patient today and changes made to appropriate areas of the chart.   All ROS negative except what is listed above and in the HPI.      Objective:    BP 134/87 (BP Location: Left Arm, Patient Position: Sitting, Cuff Size: Normal)    Pulse 77    Temp 98.2 F (36.8 C) (Oral)    Wt 135 lb 1.9 oz (61.3 kg)    BMI 22.79 kg/m   Wt Readings from Last 3 Encounters:  11/30/21 135 lb 1.9 oz (61.3 kg)  08/09/20 135 lb (61.2 kg)  07/24/20 139 lb (63 kg)    Physical Exam Vitals reviewed.  Constitutional:      Appearance: Normal appearance. She is normal weight.  HENT:      Head: Normocephalic and atraumatic.     Right Ear: Tympanic membrane normal.     Left Ear: Tympanic membrane normal.     Nose: Nose normal.     Mouth/Throat:     Mouth: Mucous membranes are moist.     Pharynx: Oropharynx is clear.  Eyes:     Extraocular Movements: Extraocular movements intact.     Conjunctiva/sclera: Conjunctivae normal.     Pupils: Pupils are equal, round, and reactive to light.  Cardiovascular:     Rate and Rhythm: Normal rate and regular rhythm.     Pulses: Normal pulses.     Heart sounds: Normal heart sounds.  Pulmonary:     Effort: Pulmonary effort is normal.     Breath sounds: Normal breath sounds.  Abdominal:     General: Abdomen is flat. Bowel sounds are normal.     Palpations: Abdomen is soft.  Musculoskeletal:        General: Normal range of motion.     Cervical back: Normal range of motion and neck supple.  Skin:    General: Skin is warm and dry.     Capillary Refill: Capillary refill takes less than 2 seconds.  Neurological:     General: No focal deficit present.     Mental Status: She is alert and oriented to person, place, and time. Mental status is at baseline.  Psychiatric:        Mood and Affect: Mood normal.        Behavior: Behavior normal.        Thought Content: Thought content normal.        Judgment: Judgment normal.      Results for orders placed or performed in visit on 08/09/20  CBC  Result Value Ref Range   WBC 4.6 3.8 - 10.8 Thousand/uL   RBC 4.39 3.80 - 5.10 Million/uL   Hemoglobin 12.8 11.7 - 15.5 g/dL   HCT 38.0 35.0 - 45.0 %   MCV 86.6 80.0 - 100.0 fL   MCH 29.2 27.0 - 33.0 pg   MCHC 33.7 32.0 - 36.0 g/dL   RDW 12.9 11.0 - 15.0 %   Platelets 349 140 - 400 Thousand/uL   MPV 10.5 7.5 - 12.5 fL  COMPLETE METABOLIC PANEL WITH GFR  Result Value Ref Range   Glucose, Bld 100 (H) 65 - 99 mg/dL   BUN 7 7 - 25 mg/dL   Creat 0.69 0.50 - 1.10 mg/dL   GFR, Est Non African American 105 >  OR = 60 mL/min/1.44m2   GFR, Est  African American 122 > OR = 60 mL/min/1.66m2   BUN/Creatinine Ratio NOT APPLICABLE 6 - 22 (calc)   Sodium 140 135 - 146 mmol/L   Potassium 4.5 3.5 - 5.3 mmol/L   Chloride 102 98 - 110 mmol/L   CO2 30 20 - 32 mmol/L   Calcium 9.9 8.6 - 10.2 mg/dL   Total Protein 7.8 6.1 - 8.1 g/dL   Albumin 4.7 3.6 - 5.1 g/dL   Globulin 3.1 1.9 - 3.7 g/dL (calc)   AG Ratio 1.5 1.0 - 2.5 (calc)   Total Bilirubin 1.1 0.2 - 1.2 mg/dL   Alkaline phosphatase (APISO) 49 31 - 125 U/L   AST 18 10 - 35 U/L   ALT 12 6 - 29 U/L  Lipid panel  Result Value Ref Range   Cholesterol 206 (H) <200 mg/dL   HDL 64 > OR = 50 mg/dL   Triglycerides 98 <150 mg/dL   LDL Cholesterol (Calc) 122 (H) mg/dL (calc)   Total CHOL/HDL Ratio 3.2 <5.0 (calc)   Non-HDL Cholesterol (Calc) 142 (H) <130 mg/dL (calc)  Fe+TIBC+Fer  Result Value Ref Range   Iron 75 40 - 190 mcg/dL   TIBC 383 250 - 450 mcg/dL (calc)   %SAT 20 16 - 45 % (calc)   Ferritin 18 16 - 232 ng/mL      Assessment & Plan:   Problem List Items Addressed This Visit       Other   Annual physical exam - Primary   Relevant Orders   CBC with Differential/Platelet   Comprehensive metabolic panel   Lipid panel   TSH   Cologuard   Other Visit Diagnoses     Colon cancer screening       Relevant Orders   Cologuard   Need for Tdap vaccination       Relevant Orders   Tdap vaccine greater than or equal to 7yo IM (Completed)          LABORATORY TESTING:  - Health maintenance labs ordered today as discussed above.           CBC, CMP, LIPIDS          TSH - high risk or symptoms          A1c - hx, high risk, symptomatic  - STI testing: deferred - Pap smear: up to date    IMMUNIZATIONS:   - Tdap: Tetanus vaccination status reviewed: Td vaccination indicated and given today. - Influenza: Refused - Pneumovax: Not applicable - Prevnar: Not applicable - HPV: Not applicable - Shingrix vaccine: Not applicable - [id number]: Given elsewhere  SCREENING: -  Mammogram: Up to date - Bone Density: Not applicable - Colonoscopy: Ordered today  Discussed with patient purpose of the colonoscopy is to detect colon cancer at curable precancerous or early stages  - AAA Screening: Not applicable  -Hearing Test: Not applicable  -Spirometry: Not applicable  - Lung Cancer Screening: Not applicable    PATIENT COUNSELING:   Advised to take 1 mg of folate supplement per day if capable of pregnancy.   Sexuality: Discussed sexually transmitted diseases, partner selection, use of condoms, avoidance of unintended pregnancy, and contraceptive alternatives.    I discussed with the patient that most people either abstain from alcohol or drink within safe limits (<=14/week and <=4 drinks/occasion for males, <=7/weeks and <= 3 drinks/occasion for females) and that the risk for alcohol disorders and other health effects rises proportionally with  the number of drinks per week and how often a drinker exceeds daily limits.  Discussed cessation/primary prevention of drug use and availability of treatment for abuse.   Diet: Encouraged to adjust caloric intake to maintain or achieve ideal body weight, to reduce intake of dietary saturated fat and total fat, to limit sodium intake by avoiding high sodium foods and not adding table salt, and to maintain adequate dietary potassium and calcium preferably from fresh fruits, vegetables, and low-fat dairy products. Encouraged vitamin D 1000 units and Calcium 1300mg  or 4 servings of dairy a day.  Emphasized the importance of regular exercise.  Injury prevention: Discussed safety belts, safety helmets, smoke detector, smoking near bedding or upholstery.   Dental health: Discussed importance of regular tooth brushing, flossing, and dental visits.  Follow up plan:  Return in about 1 year (around 11/30/2022).   Purcell Nails Olevia Bowens, DNP, FNP-C

## 2022-01-16 ENCOUNTER — Ambulatory Visit: Payer: BC Managed Care – PPO | Admitting: Medical-Surgical

## 2022-03-14 ENCOUNTER — Ambulatory Visit (INDEPENDENT_AMBULATORY_CARE_PROVIDER_SITE_OTHER): Payer: BC Managed Care – PPO | Admitting: Medical-Surgical

## 2022-03-14 ENCOUNTER — Encounter: Payer: Self-pay | Admitting: Medical-Surgical

## 2022-03-14 VITALS — BP 154/85 | HR 82 | Resp 16 | Ht 65.0 in | Wt 139.0 lb

## 2022-03-14 DIAGNOSIS — Z1211 Encounter for screening for malignant neoplasm of colon: Secondary | ICD-10-CM

## 2022-03-14 DIAGNOSIS — Z7689 Persons encountering health services in other specified circumstances: Secondary | ICD-10-CM | POA: Diagnosis not present

## 2022-03-14 DIAGNOSIS — Z131 Encounter for screening for diabetes mellitus: Secondary | ICD-10-CM

## 2022-03-14 DIAGNOSIS — Z1329 Encounter for screening for other suspected endocrine disorder: Secondary | ICD-10-CM | POA: Diagnosis not present

## 2022-03-14 DIAGNOSIS — D508 Other iron deficiency anemias: Secondary | ICD-10-CM

## 2022-03-14 DIAGNOSIS — Z0000 Encounter for general adult medical examination without abnormal findings: Secondary | ICD-10-CM

## 2022-03-14 DIAGNOSIS — Z638 Other specified problems related to primary support group: Secondary | ICD-10-CM | POA: Diagnosis not present

## 2022-03-14 DIAGNOSIS — Z13220 Encounter for screening for lipoid disorders: Secondary | ICD-10-CM

## 2022-03-14 NOTE — Patient Instructions (Signed)

## 2022-03-14 NOTE — Progress Notes (Signed)
?  HPI with pertinent ROS:  ? ?CC: transfer of care ? ?HPI: ?Pleasant 46 year old female presenting today to transfer care to a new PCP.  She is doing well overall and does not have any significant medical concerns.  Not currently taking any medications.  She is accompanied by her daughter today who is also here for transfer of care appointment.  They were room separately because she is significantly concerned about her daughter's behaviors as a teenager.  Notes that she and her husband are currently separated and working to finalize a divorce.  This is an amicable situation and communication between them is several.  She feels like her daughter is not handling the separation well and may be making some poor choices as a way of acting out.  Overall, feels like she is handling the separation and divorce well although she does admit that there are times when she gets down.  She knows this is situational and things will all work out.  Not currently interested in medication or counseling.  Notes time constraints as she is now responsible for doing everything that was previously shared. ? ?I reviewed the past medical history, family history, social history, surgical history, and allergies today and no changes were needed.  Please see the problem list section below in epic for further details. ? ? ?Physical exam:  ? ?General: Well Developed, well nourished, and in no acute distress.  ?Neuro: Alert and oriented x3, extra-ocular muscles intact, sensation grossly intact.  ?HEENT: Normocephalic, atraumatic, pupils equal round reactive to light, neck supple, no masses, no lymphadenopathy, thyroid nonpalpable.  ?Skin: Warm and dry. ?Cardiac: Regular rate and rhythm, no murmurs rubs or gallops, no lower extremity edema.  ?Respiratory: Clear to auscultation bilaterally. Not using accessory muscles, speaking in full sentences. ?Abdomen: Soft, nontender, nondistended. Bowel sounds + x 4 quadrants. No HSM appreciated. ? ? ?Impression  and Recommendations:   ? ?1. Encounter to establish care ?Reviewed available information and discussed care concerns with patient.  ? ?2.  Parental concern about child ?Advised her that I will be meeting with her daughter next and that we will review recommendations for safe and appropriate behaviors during teenage years.  Recommend she facilitate communicating with her daughter in any way possible even if this requires professional intervention with PCP or mental health professionals. ? ?3.  Other iron deficiency anemia ?Checking CBC with differential. ?- CBC with Differential/Platelet ? ?4. Thyroid disorder screen ?Checking TSH. ?- TSH ? ?5. Diabetes mellitus screening ?Checking CMP. ?- COMPLETE METABOLIC PANEL WITH GFR ? ?6. Lipid screening ?Checking lipid panel. ?- Lipid Panel w/reflex Direct LDL ? ?7. Colon cancer screening ?Discussed screening recommendations.  She would like to have Cologuard so ordering that today. ?- Cologuard ? ?Return in about 9 months (around 12/14/2022) for annual physical exam or sooner if needed. ?___________________________________________ ?Clearnce Sorrel, DNP, APRN, FNP-BC ?Primary Care and Sports Medicine ?[place] ?

## 2022-03-27 ENCOUNTER — Telehealth: Payer: Self-pay

## 2022-03-27 NOTE — Telephone Encounter (Signed)
Faxed the necessary information to [employer] for Cologuard to [phone] ? ?Fax confirmation Successful ?

## 2023-11-25 ENCOUNTER — Encounter: Payer: BC Managed Care – PPO | Admitting: Medical-Surgical

## 2023-12-23 ENCOUNTER — Encounter: Payer: Managed Care, Other (non HMO) | Admitting: Medical-Surgical

## 2023-12-23 NOTE — Progress Notes (Deleted)
   Complete physical exam  Patient: Rajean Desantiago   DOB: Jul 06, 1976   48 y.o. Female  MRN: [id number]  Subjective:    No chief complaint on file.   Keri Veale is a 48 y.o. female who presents today for a complete physical exam. She reports consuming a {diet types:17450} diet. {types:19826} She generally feels {DESC; WELL/FAIRLY WELL/POORLY:18703}. She reports sleeping {DESC; WELL/FAIRLY WELL/POORLY:18703}. She {does/does not:200015} have additional problems to discuss today.    Most recent fall risk assessment:    03/14/2022    2:10 PM  Fall Risk   Falls in the past year? 0  Number falls in past yr: 0  Injury with Fall? 0  Risk for fall due to : No Fall Risks  Follow up Falls prevention discussed;Falls evaluation completed     Most recent depression screenings:    03/14/2022    2:38 PM 11/30/2021    9:47 AM  PHQ 2/9 Scores  PHQ - 2 Score 4 1  PHQ- 9 Score 12     {VISON DENTAL STD PSA (Optional):27386}  {History (Optional):23778}  Patient Care Team: Willo Mini, NP as PCP - General (Nurse Practitioner)   No outpatient medications prior to visit.   No facility-administered medications prior to visit.    ROS        Objective:     There were no vitals taken for this visit. {Vitals History (Optional):23777}  Physical Exam   No results found for any visits on 12/23/23. {Show previous labs (optional):23779}    Assessment & Plan:    Routine Health Maintenance and Physical Exam  Immunization History  Administered Date(s) Administered   PFIZER(Purple Top)SARS-COV-2 Vaccination 01/15/2021, 02/05/2021   Tdap 11/30/2021    Health Maintenance  Topic Date Due   HIV Screening  Never done   Hepatitis C Screening  Never done   Colonoscopy  Never done   MAMMOGRAM  08/24/2022   INFLUENZA VACCINE  Never done   Cervical Cancer Screening (HPV/Pap Cotest)  08/14/2023   COVID-19 Vaccine (3 - 2024-25 season) 08/17/2023   DTaP/Tdap/Td (2 - Td or Tdap) 12/01/2031    HPV VACCINES  Aged Out    Discussed health benefits of physical activity, and encouraged her to engage in regular exercise appropriate for her age and condition.  Problem List Items Addressed This Visit     RESOLVED: Annual physical exam - Primary   Other Visit Diagnoses       Encounter for screening mammogram for malignant neoplasm of breast         Cervical cancer screening         Colon cancer screening          No follow-ups on file.     Ilo Beamon, NP
